# Patient Record
Sex: Female | Born: 1985 | Race: Black or African American | Hispanic: No | Marital: Single | Smoking: Never smoker
Health system: Southern US, Community
[De-identification: ages and names within clinical notes are randomized; demographics above are authoritative.]

## PROBLEM LIST (undated history)

## (undated) ENCOUNTER — Inpatient Hospital Stay (HOSPITAL_COMMUNITY): Payer: Self-pay

## (undated) DIAGNOSIS — Z789 Other specified health status: Secondary | ICD-10-CM

## (undated) HISTORY — DX: Other specified health status: Z78.9

---

## 2015-12-10 NOTE — L&D Delivery Note (Signed)
Delivery Note After approx 3 pushes total, At 8:44 PM a viable female was delivered via VBAC, Spontaneous (Presentation: ; Occiput Anterior).  APGAR: 9, 9; weight: pending .   Placenta status: Intact, Spontaneous.  Cord: 3 vessels with the following complications: None.  Infant dried and placed on pt's abd; cord clamped and cut by family member. Hospital cord blood sample collected.  Anesthesia: Epidural  Episiotomy:  none Lacerations:  L labial Suture Repair: 3.0 vicryl; repaired w/ two interrupted sutures Est. Blood Loss (mL):    Mom to postpartum.  Baby to Couplet care / Skin to Skin.  Cam HaiSHAW, Loyal Rudy CNM 06/21/2016, 9:03 PM

## 2016-04-02 ENCOUNTER — Inpatient Hospital Stay (HOSPITAL_COMMUNITY)
Admission: AD | Admit: 2016-04-02 | Discharge: 2016-04-02 | Disposition: A | Discharge status: Home / Self Care | Payer: Self-pay | Source: Ambulatory Visit | Attending: Family Medicine | Admitting: Family Medicine

## 2016-04-02 ENCOUNTER — Encounter (HOSPITAL_COMMUNITY): Payer: Self-pay | Admitting: *Deleted

## 2016-04-02 DIAGNOSIS — R109 Unspecified abdominal pain: Secondary | ICD-10-CM | POA: Insufficient documentation

## 2016-04-02 DIAGNOSIS — O368190 Decreased fetal movements, unspecified trimester, not applicable or unspecified: Secondary | ICD-10-CM

## 2016-04-02 DIAGNOSIS — Z3A29 29 weeks gestation of pregnancy: Secondary | ICD-10-CM | POA: Insufficient documentation

## 2016-04-02 DIAGNOSIS — O26899 Other specified pregnancy related conditions, unspecified trimester: Secondary | ICD-10-CM

## 2016-04-02 DIAGNOSIS — O9989 Other specified diseases and conditions complicating pregnancy, childbirth and the puerperium: Secondary | ICD-10-CM

## 2016-04-02 DIAGNOSIS — O26893 Other specified pregnancy related conditions, third trimester: Secondary | ICD-10-CM | POA: Insufficient documentation

## 2016-04-02 DIAGNOSIS — O368130 Decreased fetal movements, third trimester, not applicable or unspecified: Secondary | ICD-10-CM | POA: Insufficient documentation

## 2016-04-02 LAB — URINE MICROSCOPIC-ADD ON: RBC / HPF: NONE SEEN RBC/hpf (ref 0–5)

## 2016-04-02 LAB — URINALYSIS, ROUTINE W REFLEX MICROSCOPIC
Glucose, UA: NEGATIVE mg/dL
Hgb urine dipstick: NEGATIVE
KETONES UR: 15 mg/dL — AB
LEUKOCYTES UA: NEGATIVE
NITRITE: NEGATIVE
PH: 7 (ref 5.0–8.0)
Protein, ur: 30 mg/dL — AB
Specific Gravity, Urine: 1.02 (ref 1.005–1.030)

## 2016-04-02 LAB — CBC
HEMATOCRIT: 31.6 % — AB (ref 36.0–46.0)
HEMOGLOBIN: 10.8 g/dL — AB (ref 12.0–15.0)
MCH: 31.6 pg (ref 26.0–34.0)
MCHC: 34.2 g/dL (ref 30.0–36.0)
MCV: 92.4 fL (ref 78.0–100.0)
Platelets: 212 10*3/uL (ref 150–400)
RBC: 3.42 MIL/uL — AB (ref 3.87–5.11)
RDW: 13.6 % (ref 11.5–15.5)
WBC: 6.2 10*3/uL (ref 4.0–10.5)

## 2016-04-02 LAB — DIFFERENTIAL
Basophils Absolute: 0 10*3/uL (ref 0.0–0.1)
Basophils Relative: 0 %
EOS PCT: 3 %
Eosinophils Absolute: 0.2 10*3/uL (ref 0.0–0.7)
LYMPHS ABS: 2.1 10*3/uL (ref 0.7–4.0)
LYMPHS PCT: 34 %
MONOS PCT: 4 %
Monocytes Absolute: 0.2 10*3/uL (ref 0.1–1.0)
NEUTROS PCT: 59 %
Neutro Abs: 3.7 10*3/uL (ref 1.7–7.7)

## 2016-04-02 LAB — OB RESULTS CONSOLE ABO/RH: RH TYPE: POSITIVE

## 2016-04-02 LAB — OB RESULTS CONSOLE RPR: RPR: NONREACTIVE

## 2016-04-02 LAB — TYPE AND SCREEN
ABO/RH(D): B POS
ANTIBODY SCREEN: NEGATIVE

## 2016-04-02 LAB — ABO/RH: ABO/RH(D): B POS

## 2016-04-02 LAB — HEPATITIS B SURFACE ANTIGEN: Hepatitis B Surface Ag: NEGATIVE

## 2016-04-02 NOTE — MAU Note (Signed)
Urine in lab 

## 2016-04-02 NOTE — MAU Provider Note (Signed)
MAU HISTORY AND PHYSICAL  Chief Complaint:  Abdominal Pain and Decreased Fetal Movement   Tiffany Silva is a 30 y.o.  G2P1001 with IUP at 6523w4d presenting for Abdominal Pain and Decreased Fetal Movement  Lives in [place], here on vacation. Prenatal care there. Says normal labs and u/s. No complications. Last pregnancy c/b breech, required c/s.  Here today with pain at site of c/s scar. Last night decreased fetal movement. Today normal fetal movement. Otherwise feeling well. No fevers. No contractions. No bleeding. No leakage of fluid. No dysuria or hematuria. No back pain   History reviewed. No pertinent past medical history.  Past Surgical History  Procedure Laterality Date  . Cesarean section      History reviewed. No pertinent family history.  Social History  Substance Use Topics  . Smoking status: Never Smoker   . Smokeless tobacco: None  . Alcohol Use: No    No Known Allergies  No prescriptions prior to admission    Review of Systems - Negative except for what is mentioned in HPI.  Physical Exam  Blood pressure 110/70, pulse 88, temperature 98.1 F (36.7 C), temperature source Oral, resp. rate 18, height 5' 4.5" (1.638 m), weight 221 lb 3.2 oz (100.336 kg), last menstrual period 09/08/2015. GENERAL: Well-developed, well-nourished female in no acute distress.  LUNGS: Clear to auscultation bilaterally.  HEART: Regular rate and rhythm. ABDOMEN: Soft, nontender, nondistended, gravid.  EXTREMITIES: Nontender, no edema, 2+ distal pulses. Cervical Exam: deferred FHT:  145/mod/+a/-d Contractions: none  Bedside u/s: SDP 5.00 cm, AFI 12.06 cm   Labs: Results for orders placed or performed during the hospital encounter of 04/02/16 (from the past 24 hour(s))  Urinalysis, Routine w reflex microscopic (not [facility])   Collection Time: 04/02/16 11:43 AM  Result Value Ref Range   Color, Urine AMBER (A) YELLOW   APPearance CLEAR CLEAR   Specific Gravity, Urine 1.020 1.005  - 1.030   pH 7.0 5.0 - 8.0   Glucose, UA NEGATIVE NEGATIVE mg/dL   Hgb urine dipstick NEGATIVE NEGATIVE   Bilirubin Urine SMALL (A) NEGATIVE   Ketones, ur 15 (A) NEGATIVE mg/dL   Protein, ur 30 (A) NEGATIVE mg/dL   Nitrite NEGATIVE NEGATIVE   Leukocytes, UA NEGATIVE NEGATIVE  Urine microscopic-add on   Collection Time: 04/02/16 11:43 AM  Result Value Ref Range   Squamous Epithelial / LPF 0-5 (A) NONE SEEN   WBC, UA 0-5 0 - 5 WBC/hpf   RBC / HPF NONE SEEN 0 - 5 RBC/hpf   Bacteria, UA FEW (A) NONE SEEN   Crystals CA OXALATE CRYSTALS (A) NEGATIVE   Urine-Other MUCOUS PRESENT     Imaging Studies:  No results found.  Assessment: Tiffany Silva is  30 y.o. G2P1001 at 5023w4d presents with Abdominal Pain and Decreased Fetal Movement Abdominal pain is mild. No uterine tenderness or bleeding or non-reassuring FHTs to suggest abruption. Pain is in suprapubic region, but no other uti symptoms, and ua not suggestive of infection. Afebrile, normal PO, no right-sided pain - do not think appendix or hepatopancreobiliary. W/ regard to decreased fetal movement, per patient that has resolved, nst reactive for gestational age, and afi wnl.  Patient is in the country visiting from [place]. Not sure if she will return. I've thus collected ob labs and will order outpatient u/s as patient says she has only had an early u/s. Referring to our clinic.  Plan: - abdominal pain, ptl, pprom return precautions - kick counts, decreased fetal mvmt return precautions -  anatomy scan ordered - WOC f/u, message sent  Tiffany Silva 4/25/20171:39 PM

## 2016-04-02 NOTE — MAU Note (Signed)
Pain in lower abd, started 2 days ago- comes and goes, getting worse.  Decreased fetal movement, has not felt since last night.

## 2016-04-02 NOTE — Discharge Instructions (Signed)
Abdominal Pain During Pregnancy Abdominal pain is common in pregnancy. Most of the time, it does not cause harm. There are many causes of abdominal pain. Some causes are more serious than others. Some of the causes of abdominal pain in pregnancy are easily diagnosed. Occasionally, the diagnosis takes time to understand. Other times, the cause is not determined. Abdominal pain can be a sign that something is very wrong with the pregnancy, or the pain may have nothing to do with the pregnancy at all. For this reason, always tell your health care provider if you have any abdominal discomfort. HOME CARE INSTRUCTIONS  Monitor your abdominal pain for any changes. The following actions may help to alleviate any discomfort you are experiencing:  Do not have sexual intercourse or put anything in your vagina until your symptoms go away completely.  Get plenty of rest until your pain improves.  Drink clear fluids if you feel nauseous. Avoid solid food as long as you are uncomfortable or nauseous.  Only take over-the-counter or prescription medicine as directed by your health care provider.  Keep all follow-up appointments with your health care provider. SEEK IMMEDIATE MEDICAL CARE IF:  You are bleeding, leaking fluid, or passing tissue from the vagina.  You have increasing pain or cramping.  You have persistent vomiting.  You have painful or bloody urination.  You have a fever.  You notice a decrease in your baby's movements.  You have extreme weakness or feel faint.  You have shortness of breath, with or without abdominal pain.  You develop a severe headache with abdominal pain.  You have abnormal vaginal discharge with abdominal pain.  You have persistent diarrhea.  You have abdominal pain that continues even after rest, or gets worse. MAKE SURE YOU:   Understand these instructions.  Will watch your condition.  Will get help right away if you are not doing well or get worse.     This information is not intended to replace advice given to you by your health care provider. Make sure you discuss any questions you have with your health care provider.   Document Released: 11/25/2005 Document Revised: 09/15/2013 Document Reviewed: 06/24/2013 Elsevier Interactive Patient Education 2016 Elsevier Inc. Fetal Movement Counts Patient Name: __________________________________________________ Patient Due Date: ____________________ Performing a fetal movement count is highly recommended in high-risk pregnancies, but it is good for every pregnant woman to do. Your health care provider may ask you to start counting fetal movements at 28 weeks of the pregnancy. Fetal movements often increase:  After eating a full meal.  After physical activity.  After eating or drinking something sweet or cold.  At rest. Pay attention to when you feel the baby is most active. This will help you notice a pattern of your baby's sleep and wake cycles and what factors contribute to an increase in fetal movement. It is important to perform a fetal movement count at the same time each day when your baby is normally most active.  HOW TO COUNT FETAL MOVEMENTS  Find a quiet and comfortable area to sit or lie down on your left side. Lying on your left side provides the best blood and oxygen circulation to your baby.  Write down the day and time on a sheet of paper or in a journal.  Start counting kicks, flutters, swishes, rolls, or jabs in a 2-hour period. You should feel at least 10 movements within 2 hours.  If you do not feel 10 movements in 2 hours, wait 2-3 hours  and count again. Look for a change in the pattern or not enough counts in 2 hours. SEEK MEDICAL CARE IF:  You feel less than 10 counts in 2 hours, tried twice.  There is no movement in over an hour.  The pattern is changing or taking longer each day to reach 10 counts in 2 hours.  You feel the baby is not moving as he or she usually  does. Date: ____________ Movements: ____________ Start time: ____________ Doreatha Martin time: ____________  Date: ____________ Movements: ____________ Start time: ____________ Doreatha Martin time: ____________ Date: ____________ Movements: ____________ Start time: ____________ Doreatha Martin time: ____________ Date: ____________ Movements: ____________ Start time: ____________ Doreatha Martin time: ____________ Date: ____________ Movements: ____________ Start time: ____________ Doreatha Martin time: ____________ Date: ____________ Movements: ____________ Start time: ____________ Doreatha Martin time: ____________ Date: ____________ Movements: ____________ Start time: ____________ Doreatha Martin time: ____________ Date: ____________ Movements: ____________ Start time: ____________ Doreatha Martin time: ____________  Date: ____________ Movements: ____________ Start time: ____________ Doreatha Martin time: ____________ Date: ____________ Movements: ____________ Start time: ____________ Doreatha Martin time: ____________ Date: ____________ Movements: ____________ Start time: ____________ Doreatha Martin time: ____________ Date: ____________ Movements: ____________ Start time: ____________ Doreatha Martin time: ____________ Date: ____________ Movements: ____________ Start time: ____________ Doreatha Martin time: ____________ Date: ____________ Movements: ____________ Start time: ____________ Doreatha Martin time: ____________ Date: ____________ Movements: ____________ Start time: ____________ Doreatha Martin time: ____________  Date: ____________ Movements: ____________ Start time: ____________ Doreatha Martin time: ____________ Date: ____________ Movements: ____________ Start time: ____________ Doreatha Martin time: ____________ Date: ____________ Movements: ____________ Start time: ____________ Doreatha Martin time: ____________ Date: ____________ Movements: ____________ Start time: ____________ Doreatha Martin time: ____________ Date: ____________ Movements: ____________ Start time: ____________ Doreatha Martin time: ____________ Date: ____________ Movements:  ____________ Start time: ____________ Doreatha Martin time: ____________ Date: ____________ Movements: ____________ Start time: ____________ Doreatha Martin time: ____________  Date: ____________ Movements: ____________ Start time: ____________ Doreatha Martin time: ____________ Date: ____________ Movements: ____________ Start time: ____________ Doreatha Martin time: ____________ Date: ____________ Movements: ____________ Start time: ____________ Doreatha Martin time: ____________ Date: ____________ Movements: ____________ Start time: ____________ Doreatha Martin time: ____________ Date: ____________ Movements: ____________ Start time: ____________ Doreatha Martin time: ____________ Date: ____________ Movements: ____________ Start time: ____________ Doreatha Martin time: ____________ Date: ____________ Movements: ____________ Start time: ____________ Doreatha Martin time: ____________  Date: ____________ Movements: ____________ Start time: ____________ Doreatha Martin time: ____________ Date: ____________ Movements: ____________ Start time: ____________ Doreatha Martin time: ____________ Date: ____________ Movements: ____________ Start time: ____________ Doreatha Martin time: ____________ Date: ____________ Movements: ____________ Start time: ____________ Doreatha Martin time: ____________ Date: ____________ Movements: ____________ Start time: ____________ Doreatha Martin time: ____________ Date: ____________ Movements: ____________ Start time: ____________ Doreatha Martin time: ____________ Date: ____________ Movements: ____________ Start time: ____________ Doreatha Martin time: ____________  Date: ____________ Movements: ____________ Start time: ____________ Doreatha Martin time: ____________ Date: ____________ Movements: ____________ Start time: ____________ Doreatha Martin time: ____________ Date: ____________ Movements: ____________ Start time: ____________ Doreatha Martin time: ____________ Date: ____________ Movements: ____________ Start time: ____________ Doreatha Martin time: ____________ Date: ____________ Movements: ____________ Start time: ____________ Doreatha Martin  time: ____________ Date: ____________ Movements: ____________ Start time: ____________ Doreatha Martin time: ____________ Date: ____________ Movements: ____________ Start time: ____________ Doreatha Martin time: ____________  Date: ____________ Movements: ____________ Start time: ____________ Doreatha Martin time: ____________ Date: ____________ Movements: ____________ Start time: ____________ Doreatha Martin time: ____________ Date: ____________ Movements: ____________ Start time: ____________ Doreatha Martin time: ____________ Date: ____________ Movements: ____________ Start time: ____________ Doreatha Martin time: ____________ Date: ____________ Movements: ____________ Start time: ____________ Doreatha Martin time: ____________ Date: ____________ Movements: ____________ Start time: ____________ Doreatha Martin time: ____________ Date: ____________ Movements: ____________ Start time: ____________ Doreatha Martin time: ____________  Date: ____________ Movements: ____________ Start time: ____________ Doreatha Martin time: ____________ Date: ____________ Movements: ____________ Start time:  ____________ Doreatha MartinFinish time: ____________ Date: ____________ Movements: ____________ Start time: ____________ Doreatha MartinFinish time: ____________ Date: ____________ Movements: ____________ Start time: ____________ Doreatha MartinFinish time: ____________ Date: ____________ Movements: ____________ Start time: ____________ Doreatha MartinFinish time: ____________ Date: ____________ Movements: ____________ Start time: ____________ Doreatha MartinFinish time: ____________   This information is not intended to replace advice given to you by your health care provider. Make sure you discuss any questions you have with your health care provider.   Document Released: 12/25/2006 Document Revised: 12/16/2014 Document Reviewed: 09/21/2012 Elsevier Interactive Patient Education [employer]2016 Elsevier Inc[employer]

## 2016-04-02 NOTE — MAU Note (Signed)
Pt c/o abdominal pain x 2 days and decreased fetal movement since last night. Denies LOF or VB. Denies intercourse >24 hours. Hx of 1 CS for breech in [place]. [place] the [place] for a visit only, plans to return home on May 20th.

## 2016-04-02 NOTE — MAU Note (Signed)
Arrived 4/17 from [place]; here for a visit

## 2016-04-03 LAB — RPR: RPR Ser Ql: NONREACTIVE

## 2016-04-03 LAB — RUBELLA SCREEN: Rubella: 6.87 index (ref >0.99)

## 2016-04-03 LAB — CULTURE, OB URINE

## 2016-04-03 LAB — HIV ANTIBODY (ROUTINE TESTING W REFLEX): HIV Screen 4th Generation wRfx: NONREACTIVE

## 2016-04-09 ENCOUNTER — Encounter (HOSPITAL_COMMUNITY): Payer: Self-pay | Admitting: Obstetrics and Gynecology

## 2016-04-15 ENCOUNTER — Other Ambulatory Visit (HOSPITAL_COMMUNITY): Payer: Self-pay | Admitting: Obstetrics and Gynecology

## 2016-04-15 ENCOUNTER — Ambulatory Visit (HOSPITAL_COMMUNITY)
Admission: RE | Admit: 2016-04-15 | Discharge: 2016-04-15 | Disposition: A | Discharge status: Home / Self Care | Payer: Self-pay | Source: Ambulatory Visit | Attending: Obstetrics and Gynecology | Admitting: Obstetrics and Gynecology

## 2016-04-15 DIAGNOSIS — Z3689 Encounter for other specified antenatal screening: Secondary | ICD-10-CM

## 2016-04-15 DIAGNOSIS — O368190 Decreased fetal movements, unspecified trimester, not applicable or unspecified: Secondary | ICD-10-CM

## 2016-04-15 DIAGNOSIS — Z3A31 31 weeks gestation of pregnancy: Secondary | ICD-10-CM

## 2016-04-15 DIAGNOSIS — O26899 Other specified pregnancy related conditions, unspecified trimester: Secondary | ICD-10-CM

## 2016-04-15 DIAGNOSIS — O0933 Supervision of pregnancy with insufficient antenatal care, third trimester: Secondary | ICD-10-CM

## 2016-04-15 DIAGNOSIS — Z36 Encounter for antenatal screening of mother: Secondary | ICD-10-CM | POA: Insufficient documentation

## 2016-04-15 DIAGNOSIS — R109 Unspecified abdominal pain: Secondary | ICD-10-CM

## 2016-04-22 ENCOUNTER — Ambulatory Visit (INDEPENDENT_AMBULATORY_CARE_PROVIDER_SITE_OTHER): Payer: Self-pay | Admitting: Family Medicine

## 2016-04-22 ENCOUNTER — Encounter: Payer: Self-pay | Admitting: Family Medicine

## 2016-04-22 VITALS — BP 102/68 | HR 81 | Ht 65.0 in | Wt 223.3 lb

## 2016-04-22 DIAGNOSIS — O0930 Supervision of pregnancy with insufficient antenatal care, unspecified trimester: Secondary | ICD-10-CM | POA: Insufficient documentation

## 2016-04-22 DIAGNOSIS — O34219 Maternal care for unspecified type scar from previous cesarean delivery: Secondary | ICD-10-CM

## 2016-04-22 DIAGNOSIS — O0933 Supervision of pregnancy with insufficient antenatal care, third trimester: Secondary | ICD-10-CM

## 2016-04-22 DIAGNOSIS — Z124 Encounter for screening for malignant neoplasm of cervix: Secondary | ICD-10-CM

## 2016-04-22 DIAGNOSIS — Z113 Encounter for screening for infections with a predominantly sexual mode of transmission: Secondary | ICD-10-CM

## 2016-04-22 DIAGNOSIS — O3429 Maternal care due to uterine scar from other previous surgery: Secondary | ICD-10-CM

## 2016-04-22 DIAGNOSIS — O0993 Supervision of high risk pregnancy, unspecified, third trimester: Secondary | ICD-10-CM | POA: Insufficient documentation

## 2016-04-22 DIAGNOSIS — Z1151 Encounter for screening for human papillomavirus (HPV): Secondary | ICD-10-CM

## 2016-04-22 DIAGNOSIS — Z23 Encounter for immunization: Secondary | ICD-10-CM

## 2016-04-22 LAB — POCT URINALYSIS DIP (DEVICE)
Glucose, UA: NEGATIVE mg/dL
Hgb urine dipstick: NEGATIVE
KETONES UR: NEGATIVE mg/dL
Leukocytes, UA: NEGATIVE
Nitrite: NEGATIVE
PH: 6 (ref 5.0–8.0)
PROTEIN: 30 mg/dL — AB
UROBILINOGEN UA: 1 mg/dL (ref 0.0–1.0)

## 2016-04-22 NOTE — Patient Instructions (Signed)

## 2016-04-22 NOTE — Progress Notes (Signed)
Subjective:  Tiffany CirriRegina Silva is a 30 y.o. G2P1001 at 5642w3d being seen today for ongoing prenatal care.  She is currently monitored for the following issues for this low-risk pregnancy and has Supervision of high risk pregnancy, antepartum; Late prenatal care affecting pregnancy, antepartum; and Uterine scar from previous cesarean delivery affecting pregnancy on her problem list.  Patient reports no complaints.  Contractions: Not present. Vag. Bleeding: None.  Movement: Present. Denies leaking of fluid.   The following portions of the patient's history were reviewed and updated as appropriate: allergies, current medications, past family history, past medical history, past social history, past surgical history and problem list. Problem list updated.  Objective:   Filed Vitals:   04/22/16 0807 04/22/16 0812  BP: 102/68   Pulse: 81   Height:  5\' 5"  (1.651 m)  Weight: 223 lb 4.8 oz (101.288 kg)     Fetal Status:     Movement: Present     General:  Alert, oriented and cooperative. Patient is in no acute distress.  Skin: Skin is warm and dry. No rash noted.   Cardiovascular: Normal heart rate noted  Respiratory: Normal respiratory effort, no problems with respiration noted  Abdomen: Soft, gravid, appropriate for gestational age. Pain/Pressure: Present     Pelvic: Vag. Bleeding: None     Cervical exam performed        Extremities: Normal range of motion.  Edema: Trace  Mental Status: Normal mood and affect. Normal behavior. Normal judgment and thought content.   Urinalysis:      Assessment and Plan:  Pregnancy: G2P1001 at 4542w3d  1. Supervision of high risk pregnancy, antepartum, third trimester - added box and discussed care model - Cytology - PAP - GC[facility]Chlamydia probe amp ([place]; Standing - Culture, OB Urine - Prescript Monitor Profile(19) - Flu Vaccine QUAD 36+ mos IM - GC/Chlamydia probe amp ([place]  2. Late prenatal care affecting pregnancy,  antepartum, third trimester  3. Uterine scar from previous cesarean delivery affecting pregnancy Given TOLAC consent, discussed at length. Patient would like to read  Preterm labor symptoms and general obstetric precautions including but not limited to vaginal bleeding, contractions, leaking of fluid and fetal movement were reviewed in detail with the patient. Please refer to After Visit Summary for other counseling recommendations.  Return in about 2 weeks (around 05/06/2016) for Routine prenatal care.   Federico FlakeKimberly Niles Adeena Bernabe, MD

## 2016-04-23 ENCOUNTER — Encounter: Payer: Self-pay | Admitting: Family Medicine

## 2016-04-23 LAB — GC/CHLAMYDIA PROBE AMP (~~LOC~~) NOT AT ARMC
CHLAMYDIA, DNA PROBE: NEGATIVE
Neisseria Gonorrhea: NEGATIVE

## 2016-04-23 LAB — GLUCOSE TOLERANCE, 1 HOUR (50G) W/O FASTING: Glucose, 1 Hr, gestational: 119 mg/dL (ref <140)

## 2016-04-23 LAB — CYTOLOGY - PAP

## 2016-04-24 LAB — PRESCRIPTION MONITORING PROFILE (19 PANEL)
AMPHETAMINE/METH: NEGATIVE ng/mL
BARBITURATE SCREEN, URINE: NEGATIVE ng/mL
BENZODIAZEPINE SCREEN, URINE: NEGATIVE ng/mL
BUPRENORPHINE, URINE: NEGATIVE ng/mL
Cannabinoid Scrn, Ur: NEGATIVE ng/mL
Carisoprodol, Urine: NEGATIVE ng/mL
Cocaine Metabolites: NEGATIVE ng/mL
Creatinine, Urine: 253.58 mg/dL (ref >20.0)
FENTANYL URINE: NEGATIVE ng/mL
MDMA URINE: NEGATIVE ng/mL
METHAQUALONE SCREEN (URINE): NEGATIVE ng/mL
Meperidine, Ur: NEGATIVE ng/mL
Methadone Screen, Urine: NEGATIVE ng/mL
NITRITES URINE, INITIAL: NEGATIVE ug/mL
OPIATE SCREEN, URINE: NEGATIVE ng/mL
OXYCODONE SCRN UR: NEGATIVE ng/mL
Phencyclidine, Ur: NEGATIVE ng/mL
Propoxyphene: NEGATIVE ng/mL
TAPENTADOLUR: NEGATIVE ng/mL
Tramadol Scrn, Ur: NEGATIVE ng/mL
ZOLPIDEM, URINE: NEGATIVE ng/mL
pH, Initial: 6.7 pH (ref 4.5–8.9)

## 2016-04-24 LAB — CULTURE, OB URINE: Colony Count: 2000

## 2016-04-26 ENCOUNTER — Telehealth: Payer: Self-pay | Admitting: Obstetrics & Gynecology

## 2016-04-26 NOTE — Telephone Encounter (Signed)
Patient want a RX for Prenatal Vitamin's     [facility] [address]

## 2016-05-13 ENCOUNTER — Ambulatory Visit (INDEPENDENT_AMBULATORY_CARE_PROVIDER_SITE_OTHER): Payer: Self-pay | Admitting: Obstetrics and Gynecology

## 2016-05-13 VITALS — BP 111/80 | HR 93 | Wt 223.1 lb

## 2016-05-13 DIAGNOSIS — O3429 Maternal care due to uterine scar from other previous surgery: Secondary | ICD-10-CM

## 2016-05-13 DIAGNOSIS — O34219 Maternal care for unspecified type scar from previous cesarean delivery: Secondary | ICD-10-CM

## 2016-05-13 DIAGNOSIS — Z3483 Encounter for supervision of other normal pregnancy, third trimester: Secondary | ICD-10-CM

## 2016-05-13 DIAGNOSIS — O0993 Supervision of high risk pregnancy, unspecified, third trimester: Secondary | ICD-10-CM

## 2016-05-13 LAB — POCT URINALYSIS DIP (DEVICE)
GLUCOSE, UA: NEGATIVE mg/dL
Hgb urine dipstick: NEGATIVE
KETONES UR: NEGATIVE mg/dL
Leukocytes, UA: NEGATIVE
Nitrite: NEGATIVE
PH: 6 (ref 5.0–8.0)
PROTEIN: NEGATIVE mg/dL
SPECIFIC GRAVITY, URINE: 1.025 (ref 1.005–1.030)
UROBILINOGEN UA: 1 mg/dL (ref 0.0–1.0)

## 2016-05-13 LAB — OB RESULTS CONSOLE GBS: GBS: NEGATIVE

## 2016-05-13 NOTE — Addendum Note (Signed)
Addended by: Garret ReddishBARNES, Yasmyn Bellisario M on: 05/13/2016 03:25 PM   Modules accepted: Orders

## 2016-05-13 NOTE — Progress Notes (Signed)
Cultures today 

## 2016-05-13 NOTE — Progress Notes (Signed)
Subjective:  Tiffany CirriRegina Silva is a 30 y.o. G2P1001 at 4353w3d being seen today for ongoing prenatal care.  She is currently monitored for the following issues for this low-risk pregnancy and has Supervision of high risk pregnancy, antepartum; Late prenatal care affecting pregnancy, antepartum; and Uterine scar from previous cesarean delivery affecting pregnancy on her problem list.  Patient reports no complaints.  Contractions: Not present. Vag. Bleeding: None.  Movement: Present. Denies leaking of fluid.   The following portions of the patient's history were reviewed and updated as appropriate: allergies, current medications, past family history, past medical history, past social history, past surgical history and problem list. Problem list updated.  Objective:   Filed Vitals:   05/13/16 0847  BP: 111/80  Pulse: 93  Weight: 223 lb 1.6 oz (101.197 kg)    Fetal Status: Fetal Heart Rate (bpm): 145   Movement: Present     General:  Alert, oriented and cooperative. Patient is in no acute distress.  Skin: Skin is warm and dry. No rash noted.   Cardiovascular: Normal heart rate noted  Respiratory: Normal respiratory effort, no problems with respiration noted  Abdomen: Soft, gravid, appropriate for gestational age. Pain/Pressure: Present     Pelvic: Vag. Bleeding: None     Cervical exam deferred        Extremities: Normal range of motion.  Edema: None  Mental Status: Normal mood and affect. Normal behavior. Normal judgment and thought content.   Urinalysis: Urine Protein: Negative Urine Glucose: Negative  Assessment and Plan:  Pregnancy: G2P1001 at 3153w3d  1. Supervision of high risk pregnancy, antepartum, third trimester - vbac consent signed - Culture, beta strep (group b only) - GC/Chlamydia probe amp ([place]  Preterm labor symptoms and general obstetric precautions including but not limited to vaginal bleeding, contractions, leaking of fluid and fetal movement were  reviewed in detail with the patient. Please refer to After Visit Summary for other counseling recommendations.  F/u 2 weeks  Kathrynn RunningNoah Bedford Rozina Pointer, MD

## 2016-05-14 LAB — GC/CHLAMYDIA PROBE AMP (~~LOC~~) NOT AT ARMC
CHLAMYDIA, DNA PROBE: NEGATIVE
CHLAMYDIA, DNA PROBE: NEGATIVE
NEISSERIA GONORRHEA: NEGATIVE
Neisseria Gonorrhea: NEGATIVE

## 2016-05-15 LAB — CULTURE, BETA STREP (GROUP B ONLY)

## 2016-05-21 ENCOUNTER — Encounter: Payer: Self-pay | Admitting: *Deleted

## 2016-05-27 ENCOUNTER — Ambulatory Visit (INDEPENDENT_AMBULATORY_CARE_PROVIDER_SITE_OTHER): Payer: Self-pay | Admitting: Obstetrics and Gynecology

## 2016-05-27 VITALS — BP 116/68 | HR 95 | Temp 98.5°F | Wt 226.9 lb

## 2016-05-27 DIAGNOSIS — Z3483 Encounter for supervision of other normal pregnancy, third trimester: Secondary | ICD-10-CM

## 2016-05-27 DIAGNOSIS — N390 Urinary tract infection, site not specified: Secondary | ICD-10-CM

## 2016-05-27 DIAGNOSIS — O2343 Unspecified infection of urinary tract in pregnancy, third trimester: Secondary | ICD-10-CM

## 2016-05-27 LAB — POCT URINALYSIS DIP (DEVICE)
Glucose, UA: NEGATIVE mg/dL
Hgb urine dipstick: NEGATIVE
Ketones, ur: NEGATIVE mg/dL
Leukocytes, UA: NEGATIVE
NITRITE: POSITIVE — AB
PROTEIN: 100 mg/dL — AB
Specific Gravity, Urine: >=1.03 (ref 1.005–1.030)
UROBILINOGEN UA: 1 mg/dL (ref 0.0–1.0)
pH: 6 (ref 5.0–8.0)

## 2016-05-27 NOTE — Progress Notes (Signed)
Urinalyis shows positive nitrites.

## 2016-05-27 NOTE — Progress Notes (Signed)
Subjective:  Tiffany Silva is a 30 y.o. G2P1001 at 2146w3d being seen today for ongoing prenatal care.  She is currently monitored for the following issues for this low-risk pregnancy and has Supervision of low-risk pregnancy; Late prenatal care affecting pregnancy, antepartum; and Uterine scar from previous cesarean delivery affecting pregnancy on her problem list.  Patient reports no complaints. No dysuria, frequency, or urgency. Contractions: Irregular. Vag. Bleeding: None.  Movement: Present. Denies leaking of fluid.   The following portions of the patient's history were reviewed and updated as appropriate: allergies, current medications, past family history, past medical history, past social history, past surgical history and problem list. Problem list updated.  Objective:   Filed Vitals:   05/27/16 1027  BP: 116/68  Pulse: 95  Temp: 98.5 F (36.9 C)  Weight: 226 lb 14.4 oz (102.921 kg)    Fetal Status: Fetal Heart Rate (bpm): 150   Movement: Present     General:  Alert, oriented and cooperative. Patient is in no acute distress.  Skin: Skin is warm and dry. No rash noted.   Cardiovascular: Normal heart rate noted  Respiratory: Normal respiratory effort, no problems with respiration noted  Abdomen: Soft, gravid, appropriate for gestational age. Pain/Pressure: Present     Pelvic: Cervical exam deferred        Extremities: Normal range of motion.  Edema: None  Mental Status: Normal mood and affect. Normal behavior. Normal judgment and thought content.   Urinalysis: Urine Protein: 2+ Urine Glucose: Negative  Assessment and Plan:  Pregnancy: G2P1001 at 9146w3d  # pregnancy - nitrites in urine, asymptomatic, will send for culture.   Term labor symptoms and general obstetric precautions including but not limited to vaginal bleeding, contractions, leaking of fluid and fetal movement were reviewed in detail with the patient. Please refer to After Visit Summary for other counseling  recommendations.  F/u 1 week  Kathrynn RunningNoah Bedford Piera Downs, MD

## 2016-05-28 LAB — CULTURE, OB URINE

## 2016-06-04 ENCOUNTER — Ambulatory Visit (INDEPENDENT_AMBULATORY_CARE_PROVIDER_SITE_OTHER): Payer: Self-pay | Admitting: Student

## 2016-06-04 VITALS — BP 115/75 | HR 101 | Wt 227.9 lb

## 2016-06-04 DIAGNOSIS — O2343 Unspecified infection of urinary tract in pregnancy, third trimester: Secondary | ICD-10-CM

## 2016-06-04 DIAGNOSIS — O219 Vomiting of pregnancy, unspecified: Secondary | ICD-10-CM

## 2016-06-04 DIAGNOSIS — O3429 Maternal care due to uterine scar from other previous surgery: Secondary | ICD-10-CM

## 2016-06-04 DIAGNOSIS — Z3493 Encounter for supervision of normal pregnancy, unspecified, third trimester: Secondary | ICD-10-CM

## 2016-06-04 DIAGNOSIS — O34219 Maternal care for unspecified type scar from previous cesarean delivery: Secondary | ICD-10-CM

## 2016-06-04 LAB — POCT URINALYSIS DIP (DEVICE)
Glucose, UA: NEGATIVE mg/dL
Ketones, ur: NEGATIVE mg/dL
Leukocytes, UA: NEGATIVE
NITRITE: POSITIVE — AB
PH: 6 (ref 5.0–8.0)
PROTEIN: 100 mg/dL — AB
Specific Gravity, Urine: >=1.03 (ref 1.005–1.030)
UROBILINOGEN UA: 2 mg/dL — AB (ref 0.0–1.0)

## 2016-06-04 MED ORDER — ONDANSETRON 4 MG PO TBDP: 4.0000 mg | ORAL_TABLET | Freq: Three times a day (TID) | ORAL | Status: DC | PRN

## 2016-06-04 MED ORDER — NITROFURANTOIN MONOHYD MACRO 100 MG PO CAPS: 100.0000 mg | ORAL_CAPSULE | Freq: Two times a day (BID) | ORAL | Status: DC

## 2016-06-04 NOTE — Progress Notes (Signed)
Subjective:  Tiffany Silva is a 30 y.o. G2P1001 at 1416w4d being seen today for ongoing prenatal care.  She is currently monitored for the following issues for this low-risk pregnancy and has Supervision of low-risk pregnancy; Late prenatal care affecting pregnancy, antepartum; and Uterine scar from previous cesarean delivery affecting pregnancy on her problem list.  Patient reports urinary frequency, irregular contractions, & nausea. Denies dysuria, fever/chills, hematuria, or flank pain. Contractions: Not present. Vag. Bleeding: None.  Movement: Present. Denies leaking of fluid.   The following portions of the patient's history were reviewed and updated as appropriate: allergies, current medications, past family history, past medical history, past social history, past surgical history and problem list. Problem list updated.  Objective:   Filed Vitals:   06/04/16 0933  BP: 115/75  Pulse: 101  Weight: 227 lb 14.4 oz (103.375 kg)    Fetal Status: Fetal Heart Rate (bpm): 140 Fundal Height: 37 cm Movement: Present  Presentation: Vertex  General:  Alert, oriented and cooperative. Patient is in no acute distress.  Skin: Skin is warm and dry. No rash noted.   Cardiovascular: Normal heart rate noted  Respiratory: Normal respiratory effort, no problems with respiration noted  Abdomen: Soft, gravid, appropriate for gestational age. Pain/Pressure: Present     Pelvic: Cervical exam performed Dilation: Closed Effacement (%): Thick    Extremities: Normal range of motion.     Mental Status: Normal mood and affect. Normal behavior. Normal judgment and thought content.   Urinalysis:    +nitrites   Assessment and Plan:  Pregnancy: G2P1001 at 4316w4d  1. Supervision of low-risk pregnancy, third trimester   2. Uterine scar from previous cesarean delivery affecting pregnancy   3. Nausea/vomiting in pregnancy  - ondansetron (ZOFRAN ODT) 4 MG disintegrating tablet; Take 1 tablet (4 mg total) by mouth  every 8 (eight) hours as needed for nausea or vomiting.  Dispense: 15 tablet; Refill: 0  4. UTI in pregnancy, third trimester  - nitrofurantoin, macrocrystal-monohydrate, (MACROBID) 100 MG capsule; Take 1 capsule (100 mg total) by mouth 2 (two) times daily.  Dispense: 14 capsule; Refill: 0 - Culture, OB Urine  Term labor symptoms and general obstetric precautions including but not limited to vaginal bleeding, contractions, leaking of fluid and fetal movement were reviewed in detail with the patient. Please refer to After Visit Summary for other counseling recommendations.  Return in about 1 week (around 06/11/2016) for Routine OB.   Judeth HornErin Admir Candelas, NP

## 2016-06-04 NOTE — Patient Instructions (Signed)
Vaginal Birth After Cesarean Delivery  Vaginal birth after cesarean delivery (VBAC) is giving birth vaginally after previously delivering a baby by a cesarean. In the past, if a woman had a cesarean delivery, all births afterward would be done by cesarean delivery. This is no longer true. It can be safe for the mother to try a vaginal delivery after having a cesarean delivery.   It is important to discuss VBAC with your health care provider early in the pregnancy so you can understand the risks, benefits, and options. It will give you time to decide what is best in your particular case. The final decision about whether to have a VBAC or repeat cesarean delivery should be between you and your health care provider. Any changes in your health or your baby's health during your pregnancy may make it necessary to change your initial decision about VBAC.   WOMEN WHO PLAN TO HAVE A VBAC SHOULD CHECK WITH THEIR HEALTH CARE PROVIDER TO BE SURE THAT:  · The previous cesarean delivery was done with a low transverse uterine cut (incision) (not a vertical classical incision).    · The birth canal is big enough for the baby.    · There were no other operations on the uterus.    · An electronic fetal monitor (EFM) will be on at all times during labor.    · An operating room will be available and ready in case an emergency cesarean delivery is needed.    · A health care provider and surgical nursing staff will be available at all times during labor to be ready to do an emergency delivery cesarean if necessary.    · An anesthesiologist will be present in case an emergency cesarean delivery is needed.    · The nursery is prepared and has adequate personnel and necessary equipment available to care for the baby in case of an emergency cesarean delivery.  BENEFITS OF VBAC  · Shorter stay in the hospital.    · Avoidance of risks associated with cesarean delivery, such as:    Surgical complications, such as opening of the incision or  hernia in the incision.    Injury to other organs.    Fever. This can occur if an infection develops after surgery. It can also occur as a reaction to the medicine given to make you numb during the surgery.  · Less blood loss and need for blood transfusions.  · Lower risk of blood clots and infection.   · Shorter recovery.    · Decreased risk for having to remove the uterus (hysterectomy).    · Decreased risk for the placenta to completely or partially cover the opening of the uterus (placenta previa) with a future pregnancy.    · Decrease risk in future labor and delivery.  RISKS OF A VBAC  · Tearing (rupture) of the uterus. This is occurs in less than 1% of VBACs. The risk of this happening is higher if:    Steps are taken to begin the labor process (induce labor) or stimulate or strengthen contractions (augment labor).      Medicine is used to soften (ripen) the cervix.  · Having to remove the uterus (hysterectomy) if it ruptures.  VBAC SHOULD NOT BE DONE IF:  · The previous cesarean delivery was done with a vertical (classical) or T-shaped incision or you do not know what kind of incision was made.    · You had a ruptured uterus.    · You have had certain types of surgery on your uterus, such as removal of uterine fibroids.   Ask your health care provider about other types of surgeries that prevent you from having a VBAC.  · You have certain medical or childbirth (obstetrical) problems.    · There are problems with the baby.    · You have had two previous cesarean deliveries and no vaginal deliveries.  OTHER FACTS TO KNOW ABOUT VBAC:  · It is safe to have an epidural anesthetic with VBAC.    · It is safe to turn the baby from a breech position (attempt an external cephalic version).    · It is safe to try a VBAC with twins.    · VBAC may not be successful if your baby weights 8.8 lb (4 kg) or more. However, weight predictions are not always accurate and should not be used alone to decide if VBAC is right for  you.  · There is an increased failure rate if the time between the cesarean delivery and VBAC is less than 19 months.    · Your health care provider may advise against a VBAC if you have preeclampsia (high blood pressure, protein in the urine, and swelling of face and extremities).    · VBAC is often successful if you previously gave birth vaginally.    · VBAC is often successful when the labor starts spontaneously before the due date.    · Delivering a baby through a VBAC is similar to having a normal spontaneous vaginal delivery.     This information is not intended to replace advice given to you by your health care provider. Make sure you discuss any questions you have with your health care provider.     Document Released: 05/18/2007 Document Revised: 12/16/2014 Document Reviewed: 06/24/2013  Elsevier Interactive Patient Education ©2016 [employer]

## 2016-06-06 LAB — CULTURE, OB URINE

## 2016-06-12 ENCOUNTER — Ambulatory Visit (INDEPENDENT_AMBULATORY_CARE_PROVIDER_SITE_OTHER): Payer: Self-pay | Admitting: Obstetrics and Gynecology

## 2016-06-12 VITALS — BP 112/74 | HR 106 | Wt 224.3 lb

## 2016-06-12 DIAGNOSIS — Z3493 Encounter for supervision of normal pregnancy, unspecified, third trimester: Secondary | ICD-10-CM

## 2016-06-12 DIAGNOSIS — O3429 Maternal care due to uterine scar from other previous surgery: Secondary | ICD-10-CM

## 2016-06-12 DIAGNOSIS — O34219 Maternal care for unspecified type scar from previous cesarean delivery: Secondary | ICD-10-CM

## 2016-06-12 LAB — POCT URINALYSIS DIP (DEVICE)
Glucose, UA: NEGATIVE mg/dL
HGB URINE DIPSTICK: NEGATIVE
LEUKOCYTES UA: NEGATIVE
NITRITE: POSITIVE — AB
PH: 5.5 (ref 5.0–8.0)
Protein, ur: 100 mg/dL — AB
Specific Gravity, Urine: 1.025 (ref 1.005–1.030)
UROBILINOGEN UA: 4 mg/dL — AB (ref 0.0–1.0)

## 2016-06-12 NOTE — Progress Notes (Signed)
Subjective:  Tiffany Silva is a 30 y.o. G2P1001 at 4658w5d being seen today for ongoing prenatal care.  Patient reports no complaints. States she got early PN care and US confirming LMP EDD in [place] and plans to return to [place] at about 1 month postpartum. States first baby was breech and weighed 11# with different FOB.  Still desires TOLAC. Contractions: Not present.  Vag. Bleeding: None. Movement: Present. Denies leaking of fluid.   The following portions of the patient's history were reviewed and updated as appropriate: allergies, current medications, past family history, past medical history, past social history, past surgical history and problem list.   Objective:   Filed Vitals:   06/12/16 1103  BP: 112/74  Pulse: 106  Weight: 224 lb 4.8 oz (101.742 kg)    Fetal Status: Fetal Heart Rate (bpm): 144   Movement: Present     General:  Alert, oriented and cooperative. Patient is in no acute distress.  Skin: Skin is warm and dry. No rash noted.   Cardiovascular: Normal heart rate noted  Respiratory: Normal respiratory effort, no problems with respiration noted  Abdomen: Soft, gravid, appropriate for gestational age. Pain/Pressure: Present   EFW8#  Vaginal: Vag. Bleeding: None.       Cervix: Exam revealed     posterior closed/thick /-3  Extremities: Normal range of motion.  Edema: None  Mental Status: Normal mood and affect. Normal behavior. Normal judgment and thought content.   Urinalysis:      Assessment and Plan:  Pregnancy: G2P1001 at 3858w5d  1. Supervision of low-risk pregnancy, third trimester Doing well  Term labor symptoms and general obstetric precautions including but not limited to vaginal bleeding, contractions, leaking of fluid and fetal movement were reviewed in detail with the patient. Please refer to After Visit Summary for other counseling recommendations.  Return in about 1 week (around 06/19/2016).   Danae Orleanseirdre C Poe, CNM

## 2016-06-12 NOTE — Patient Instructions (Signed)

## 2016-06-13 LAB — CULTURE, OB URINE

## 2016-06-20 ENCOUNTER — Other Ambulatory Visit: Payer: Self-pay | Admitting: Obstetrics and Gynecology

## 2016-06-20 ENCOUNTER — Ambulatory Visit (INDEPENDENT_AMBULATORY_CARE_PROVIDER_SITE_OTHER): Payer: Self-pay | Admitting: Obstetrics and Gynecology

## 2016-06-20 VITALS — BP 105/78 | HR 97 | Wt 232.9 lb

## 2016-06-20 DIAGNOSIS — O3429 Maternal care due to uterine scar from other previous surgery: Secondary | ICD-10-CM

## 2016-06-20 DIAGNOSIS — O34219 Maternal care for unspecified type scar from previous cesarean delivery: Secondary | ICD-10-CM

## 2016-06-20 DIAGNOSIS — O0993 Supervision of high risk pregnancy, unspecified, third trimester: Secondary | ICD-10-CM

## 2016-06-20 DIAGNOSIS — O480 Post-term pregnancy: Secondary | ICD-10-CM

## 2016-06-20 DIAGNOSIS — Z36 Encounter for antenatal screening of mother: Secondary | ICD-10-CM

## 2016-06-20 DIAGNOSIS — O0933 Supervision of pregnancy with insufficient antenatal care, third trimester: Secondary | ICD-10-CM

## 2016-06-20 LAB — POCT URINALYSIS DIP (DEVICE)
Glucose, UA: NEGATIVE mg/dL
HGB URINE DIPSTICK: NEGATIVE
Ketones, ur: NEGATIVE mg/dL
LEUKOCYTES UA: NEGATIVE
Nitrite: NEGATIVE
Protein, ur: NEGATIVE mg/dL
SPECIFIC GRAVITY, URINE: 1.02 (ref 1.005–1.030)
UROBILINOGEN UA: 1 mg/dL (ref 0.0–1.0)
pH: 6.5 (ref 5.0–8.0)

## 2016-06-20 NOTE — H&P (Signed)
Obstetrics Admission History & Physical  06/20/2016 - 4:26 PM Primary [facility]: [facility]  Chief Complaint: IOL for post dates  History of Present Illness  30 y.o. G2P1001 @ 8179w6d (Dating: EDC 7/7, LMP=30wk u/s), with the above CC. Pregnancy complicated by: h/o 2010 c-section for breech, late PNC, BMI 239..  Ms. Namon CirriRegina Mahurin states that she has had some decreased FM but no s/s of labor.  Review of Systems: as per HPI  PMHx:  Past Medical History  Diagnosis Date  . Medical history non-contributory    PSHx:  Past Surgical History  Procedure Laterality Date  . Cesarean section     Medications: PNV  Allergies: has No Known Allergies. OBHx:  OB History  Gravida Para Term Preterm AB SAB TAB Ectopic Multiple Living  2 1 1       1     # Outcome Date GA Lbr Len/2nd Weight Sex Delivery Anes PTL Lv  2 Current           1 Term 04/18/09    M CS-Unspec Spinal  Y    patient states that infant was 11 lbs  GYNHx:  History of STIs: No..             FHx:  Family History  Problem Relation Age of Onset  . Diabetes Father    Soc Hx:  Social History   Social History  . Marital Status: Single    Spouse Name: N/A  . Number of Children: N/A  . Years of Education: N/A   Occupational History  . Not on file.   Social History Main Topics  . Smoking status: Never Smoker   . Smokeless tobacco: Not on file  . Alcohol Use: No  . Drug Use: No  . Sexual Activity: No   Other Topics Concern  . Not on file   Social History Narrative    Objective  105/78 232 lbs  rNST Toco: negative  General: Well nourished, well developed female in no acute distress.  Skin:  Warm and dry.  Cardiovascular: S1, S2 normal, no murmur, rub or gallop, regular rate and rhythm Respiratory:  Clear to auscultation bilateral. Normal respiratory effort Abdomen: gravid, nttp Neuro/Psych:  Normal mood and affect.   SSE: deferred SVE: 1/50/-3/intermediate/mid position. Pelvis  feels adequate.  Leopolds/EFW: cephalic, 3600gm  Labs  none   Radiology As above. Incomplete anatomy scan on 5/8  Perinatal info   ABO, Rh: --/--/B POS, B POS (04/25 1341) Antibody: NEG (04/25 1341) Rubella: 6.87 (04/25 1341) RPR: Non Reactive (04/25 1341)  HBsAg: Negative (04/25 1331)  HIV: Non Reactive (04/25 1341)  GBS: neg 1 hr Glucola  119 Pap and HPV neg: 2017  Assessment & Plan   29 y.o. G2P1001 @ 3779w6d with prior c-section and decreased FM. Pt stable *IUP: fetal status reassuring. Routine care. [facility] *Post dates, prior c-section: pt previously signed TOLAC consent and reviewed with her again and slightly increased rupture risk given this will be an IOL and maternal/fetal morbidity reviewed with her and she would like to proceed. Pt told that if declines continuing with IOL to let us know. Likely foley bulb IOL. Set up for IOL tomorrow 7/14 @ 0730. *GBS: neg *incomplete anatomy scan: let peds know at delivery. Too late to order one at this point  Cornelia Copaharlie Dashanna Kinnamon, [name]. MD Attending [facility](Faculty Practice)

## 2016-06-20 NOTE — Progress Notes (Signed)
Admit orders signed and held H&P done  Tiffany Silva [name], [name] MD Attending [facility](Faculty Practice)

## 2016-06-20 NOTE — Progress Notes (Signed)
Pt reports decreased FM x3 days.

## 2016-06-21 ENCOUNTER — Inpatient Hospital Stay (HOSPITAL_COMMUNITY): Payer: Self-pay | Admitting: Anesthesiology

## 2016-06-21 ENCOUNTER — Encounter (HOSPITAL_COMMUNITY): Payer: Self-pay

## 2016-06-21 ENCOUNTER — Inpatient Hospital Stay (HOSPITAL_COMMUNITY)
Admission: RE | Admit: 2016-06-21 | Discharge: 2016-06-23 | DRG: 775 | Disposition: A | Discharge status: Home / Self Care | Payer: Self-pay | Source: Ambulatory Visit | Attending: Obstetrics & Gynecology | Admitting: Obstetrics & Gynecology

## 2016-06-21 VITALS — BP 115/75 | HR 82 | Temp 97.7°F | Resp 18 | Ht 65.0 in | Wt 232.0 lb

## 2016-06-21 DIAGNOSIS — Z833 Family history of diabetes mellitus: Secondary | ICD-10-CM

## 2016-06-21 DIAGNOSIS — O99214 Obesity complicating childbirth: Secondary | ICD-10-CM | POA: Diagnosis present

## 2016-06-21 DIAGNOSIS — Z3A41 41 weeks gestation of pregnancy: Secondary | ICD-10-CM

## 2016-06-21 DIAGNOSIS — O480 Post-term pregnancy: Principal | ICD-10-CM | POA: Diagnosis present

## 2016-06-21 DIAGNOSIS — K219 Gastro-esophageal reflux disease without esophagitis: Secondary | ICD-10-CM | POA: Diagnosis present

## 2016-06-21 DIAGNOSIS — O0933 Supervision of pregnancy with insufficient antenatal care, third trimester: Secondary | ICD-10-CM

## 2016-06-21 DIAGNOSIS — O0993 Supervision of high risk pregnancy, unspecified, third trimester: Secondary | ICD-10-CM

## 2016-06-21 DIAGNOSIS — O34211 Maternal care for low transverse scar from previous cesarean delivery: Secondary | ICD-10-CM | POA: Diagnosis present

## 2016-06-21 DIAGNOSIS — Z6838 Body mass index (BMI) 38.0-38.9, adult: Secondary | ICD-10-CM

## 2016-06-21 DIAGNOSIS — O34219 Maternal care for unspecified type scar from previous cesarean delivery: Secondary | ICD-10-CM

## 2016-06-21 DIAGNOSIS — O9962 Diseases of the digestive system complicating childbirth: Secondary | ICD-10-CM | POA: Diagnosis present

## 2016-06-21 LAB — TYPE AND SCREEN
ABO/RH(D): B POS
ANTIBODY SCREEN: NEGATIVE

## 2016-06-21 LAB — CBC
HEMATOCRIT: 32.8 % — AB (ref 36.0–46.0)
HEMOGLOBIN: 11.3 g/dL — AB (ref 12.0–15.0)
MCH: 31.7 pg (ref 26.0–34.0)
MCHC: 34.5 g/dL (ref 30.0–36.0)
MCV: 91.9 fL (ref 78.0–100.0)
Platelets: 211 10*3/uL (ref 150–400)
RBC: 3.57 MIL/uL — ABNORMAL LOW (ref 3.87–5.11)
RDW: 13.7 % (ref 11.5–15.5)
WBC: 6.2 10*3/uL (ref 4.0–10.5)

## 2016-06-21 MED ORDER — SENNOSIDES-DOCUSATE SODIUM 8.6-50 MG PO TABS
2.0000 | ORAL_TABLET | ORAL | Status: DC
  Administered 2016-06-22 (×2): 2 via ORAL
  Filled 2016-06-21 (×2): qty 2

## 2016-06-21 MED ORDER — LACTATED RINGERS IV SOLN: 500.0000 mL | Freq: Once | INTRAVENOUS | Status: DC

## 2016-06-21 MED ORDER — EPHEDRINE 5 MG/ML INJ
10.0000 mg | INTRAVENOUS | Status: DC | PRN
  Filled 2016-06-21: qty 2

## 2016-06-21 MED ORDER — ONDANSETRON HCL 4 MG/2ML IJ SOLN
4.0000 mg | INTRAMUSCULAR | Status: DC | PRN
Start: 2016-06-21 — End: 2016-06-23

## 2016-06-21 MED ORDER — LIDOCAINE HCL (PF) 1 % IJ SOLN
30.0000 mL | INTRAMUSCULAR | Status: DC | PRN
  Filled 2016-06-21: qty 30

## 2016-06-21 MED ORDER — LACTATED RINGERS IV SOLN: 500.0000 mL | INTRAVENOUS | Status: DC | PRN

## 2016-06-21 MED ORDER — OXYTOCIN 40 UNITS IN LACTATED RINGERS INFUSION - SIMPLE MED
1.0000 m[IU]/min | INTRAVENOUS | Status: DC
  Administered 2016-06-21: 2 m[IU]/min via INTRAVENOUS
  Filled 2016-06-21: qty 1000

## 2016-06-21 MED ORDER — PHENYLEPHRINE 40 MCG/ML (10ML) SYRINGE FOR IV PUSH (FOR BLOOD PRESSURE SUPPORT)
80.0000 ug | PREFILLED_SYRINGE | INTRAVENOUS | Status: DC | PRN
  Filled 2016-06-21: qty 5

## 2016-06-21 MED ORDER — TETANUS-DIPHTH-ACELL PERTUSSIS 5-2.5-18.5 LF-MCG/0.5 IM SUSP
0.5000 mL | Freq: Once | INTRAMUSCULAR | Status: DC
Start: 2016-06-22 — End: 2016-06-23

## 2016-06-21 MED ORDER — DIPHENHYDRAMINE HCL 50 MG/ML IJ SOLN: 12.5000 mg | INTRAMUSCULAR | Status: DC | PRN

## 2016-06-21 MED ORDER — LACTATED RINGERS IV SOLN
500.0000 mL | Freq: Once | INTRAVENOUS | Status: AC
  Administered 2016-06-21: 500 mL via INTRAVENOUS

## 2016-06-21 MED ORDER — PHENYLEPHRINE 40 MCG/ML (10ML) SYRINGE FOR IV PUSH (FOR BLOOD PRESSURE SUPPORT)
80.0000 ug | PREFILLED_SYRINGE | INTRAVENOUS | Status: DC | PRN
  Filled 2016-06-21: qty 10
  Filled 2016-06-21: qty 5

## 2016-06-21 MED ORDER — FENTANYL CITRATE (PF) 100 MCG/2ML IJ SOLN
50.0000 ug | INTRAMUSCULAR | Status: DC | PRN
  Administered 2016-06-21 (×2): 100 ug via INTRAVENOUS
  Filled 2016-06-21 (×2): qty 2

## 2016-06-21 MED ORDER — PRENATAL MULTIVITAMIN CH
1.0000 | ORAL_TABLET | Freq: Every day | ORAL | Status: DC
  Administered 2016-06-22 – 2016-06-23 (×2): 1 via ORAL
  Filled 2016-06-21 (×2): qty 1

## 2016-06-21 MED ORDER — ACETAMINOPHEN 325 MG PO TABS: 650.0000 mg | ORAL_TABLET | ORAL | Status: DC | PRN

## 2016-06-21 MED ORDER — ACETAMINOPHEN 325 MG PO TABS
650.0000 mg | ORAL_TABLET | ORAL | Status: DC | PRN
  Administered 2016-06-22 – 2016-06-23 (×3): 650 mg via ORAL
  Filled 2016-06-21 (×3): qty 2

## 2016-06-21 MED ORDER — BENZOCAINE-MENTHOL 20-0.5 % EX AERO
1.0000 "application " | INHALATION_SPRAY | CUTANEOUS | Status: DC | PRN
  Administered 2016-06-22: 1 via TOPICAL
  Filled 2016-06-21: qty 56

## 2016-06-21 MED ORDER — ONDANSETRON HCL 4 MG/2ML IJ SOLN: 4.0000 mg | Freq: Four times a day (QID) | INTRAMUSCULAR | Status: DC | PRN

## 2016-06-21 MED ORDER — LACTATED RINGERS IV SOLN
INTRAVENOUS | Status: DC
  Administered 2016-06-21: 11:00:00 via INTRAVENOUS

## 2016-06-21 MED ORDER — EPHEDRINE 5 MG/ML INJ: 10.0000 mg | INTRAVENOUS | Status: DC | PRN

## 2016-06-21 MED ORDER — TERBUTALINE SULFATE 1 MG/ML IJ SOLN
0.2500 mg | Freq: Once | INTRAMUSCULAR | Status: DC | PRN
  Filled 2016-06-21: qty 1

## 2016-06-21 MED ORDER — PHENYLEPHRINE 40 MCG/ML (10ML) SYRINGE FOR IV PUSH (FOR BLOOD PRESSURE SUPPORT): 80.0000 ug | PREFILLED_SYRINGE | INTRAVENOUS | Status: DC | PRN

## 2016-06-21 MED ORDER — ZOLPIDEM TARTRATE 5 MG PO TABS: 5.0000 mg | ORAL_TABLET | Freq: Every evening | ORAL | Status: DC | PRN

## 2016-06-21 MED ORDER — COCONUT OIL OIL
1.0000 "application " | TOPICAL_OIL | Status: DC | PRN
  Administered 2016-06-22: 1 via TOPICAL

## 2016-06-21 MED ORDER — FENTANYL 2.5 MCG/ML BUPIVACAINE 1/10 % EPIDURAL INFUSION (WH - ANES)
14.0000 mL/h | INTRAMUSCULAR | Status: DC | PRN
  Administered 2016-06-21: 14 mL/h via EPIDURAL
  Filled 2016-06-21: qty 125

## 2016-06-21 MED ORDER — OXYTOCIN 40 UNITS IN LACTATED RINGERS INFUSION - SIMPLE MED: 2.5000 [IU]/h | INTRAVENOUS | Status: DC

## 2016-06-21 MED ORDER — SIMETHICONE 80 MG PO CHEW: 80.0000 mg | CHEWABLE_TABLET | ORAL | Status: DC | PRN

## 2016-06-21 MED ORDER — SOD CITRATE-CITRIC ACID 500-334 MG/5ML PO SOLN: 30.0000 mL | ORAL | Status: DC | PRN

## 2016-06-21 MED ORDER — IBUPROFEN 600 MG PO TABS
600.0000 mg | ORAL_TABLET | Freq: Four times a day (QID) | ORAL | Status: DC
  Administered 2016-06-22 – 2016-06-23 (×7): 600 mg via ORAL
  Filled 2016-06-21 (×7): qty 1

## 2016-06-21 MED ORDER — OXYTOCIN BOLUS FROM INFUSION: 500.0000 mL | INTRAVENOUS | Status: DC

## 2016-06-21 MED ORDER — DIBUCAINE 1 % RE OINT: 1.0000 "application " | TOPICAL_OINTMENT | RECTAL | Status: DC | PRN

## 2016-06-21 MED ORDER — WITCH HAZEL-GLYCERIN EX PADS: 1.0000 "application " | MEDICATED_PAD | CUTANEOUS | Status: DC | PRN

## 2016-06-21 MED ORDER — DIPHENHYDRAMINE HCL 25 MG PO CAPS: 25.0000 mg | ORAL_CAPSULE | Freq: Four times a day (QID) | ORAL | Status: DC | PRN

## 2016-06-21 MED ORDER — ONDANSETRON HCL 4 MG PO TABS: 4.0000 mg | ORAL_TABLET | ORAL | Status: DC | PRN

## 2016-06-21 MED ORDER — LIDOCAINE HCL (PF) 1 % IJ SOLN
INTRAMUSCULAR | Status: DC | PRN
  Administered 2016-06-21: 6 mL via EPIDURAL
  Administered 2016-06-21: 6 mL

## 2016-06-21 MED ORDER — PHENYLEPHRINE 40 MCG/ML (10ML) SYRINGE FOR IV PUSH (FOR BLOOD PRESSURE SUPPORT)
80.0000 ug | PREFILLED_SYRINGE | INTRAVENOUS | Status: DC | PRN
  Filled 2016-06-21: qty 5
  Filled 2016-06-21: qty 10

## 2016-06-21 NOTE — Anesthesia Procedure Notes (Signed)
Epidural Patient location during procedure: OB Start time: 06/21/2016 3:25 PM End time: 06/21/2016 3:45 PM  Staffing Anesthesiologist: Jairo BenJACKSON, Jabreel Chimento Performed by: anesthesiologist   Preanesthetic Checklist Completed: patient identified, surgical consent, pre-op evaluation, timeout performed, IV checked, risks and benefits discussed and monitors and equipment checked  Epidural Patient position: sitting Prep: site prepped and draped and DuraPrep Patient monitoring: heart rate, continuous pulse ox and blood pressure Approach: midline Location: L2-L3 Injection technique: LOR air  Needle:  Needle type: Tuohy  Needle gauge: 17 G Needle length: 9 cm Needle insertion depth: 5.5 cm Catheter type: closed end flexible Catheter size: 19 Gauge Catheter at skin depth: 12 cm Test dose: negative (1% lidocaine )  Additional Notes Pt identified in Labor room.  Monitors applied. Working IV access confirmed. Sterile prep, drape lumbar spine.  1% lido local L 2,3.  #17ga Touhy LOR air at 5.5 cm L 2,3, cath in easily to 12 cm skin. Test dose OK, cath dosed and infusion begun.  Patient asymptomatic, VSS, no heme aspirated, tolerated well.  Sandford Craze Tondalaya Perren, MD Reason for block:procedure for pain

## 2016-06-21 NOTE — Progress Notes (Signed)
Dr Rockie NeighboursWoak notified that patient Tiffany Silva at 1415 clear fluid and that foley bulb fell out. No cervical check at this time. Will continue to monitor progress and need to do SVE.

## 2016-06-21 NOTE — H&P (Signed)
LABOR AND DELIVERY ADMISSION HISTORY AND PHYSICAL NOTE  Tiffany Silva is a 30 y.o. female G2P1001 with IUP at [redacted]w[redacted]d by L/31 presenting for pdiol.   She reports positive fetal movement. She denies leakage of fluid or vaginal bleeding.  Prenatal History/Complications:  Past Medical History: Past Medical History  Diagnosis Date  . Medical history non-contributory     Past Surgical History: Past Surgical History  Procedure Laterality Date  . Cesarean section      Obstetrical History: OB History    Gravida Para Term Preterm AB TAB SAB Ectopic Multiple Living   Social History: Social History   Social History  . Marital Status: Single    Spouse Name: N/A  . Number of Children: N/A  . Years of Education: N/A   Social History Main Topics  . Smoking status: Never Smoker   . Smokeless tobacco: None  . Alcohol Use: No  . Drug Use: No  . Sexual Activity: No   Other Topics Concern  . None   Social History Narrative    Family History: Family History  Problem Relation Age of Onset  . Diabetes Father     Allergies: No Known Allergies  Prescriptions prior to admission  Medication Sig Dispense Refill Last Dose  . Prenatal Vit-Fe Fumarate-FA (PRENATAL MULTIVITAMIN) TABS tablet Take 1 tablet by mouth daily at 12 noon.   06/20/2016 at Unknown time  . nitrofurantoin, macrocrystal-monohydrate, (MACROBID) 100 MG capsule Take 1 capsule (100 mg total) by mouth 2 (two) times daily. (Patient not taking: Reported on 06/12/2016) 14 capsule 0 Not Taking  . ondansetron (ZOFRAN ODT) 4 MG disintegrating tablet Take 1 tablet (4 mg total) by mouth every 8 (eight) hours as needed for nausea or vomiting. (Patient not taking: Reported on 06/12/2016) 15 tablet 0 Not Taking     Review of Systems   All systems reviewed and negative except as stated in HPI  Blood pressure 119/75, pulse 103, temperature 98.3 F (36.8 C), temperature source Oral, resp. rate 18, height   (1.651 m), weight 232 lb (105.235 kg), last menstrual period 09/08/2015. General appearance: alert, cooperative and appears stated age Lungs: clear to auscultation bilaterally Heart: regular rate and rhythm Abdomen: soft, non-tender; bowel sounds normal Extremities: No calf swelling or tenderness Presentation: cephalic Fetal monitoring: 140/mod/+a/-d Uterine activity: quiet  Dilation: 1.5 Effacement (%): Thick Exam by:: dr Ashok Pall   Prenatal labs: ABO, Rh: --/--/B POS, B POS (04/25 1341) Antibody: NEG (04/25 1341) Rubella: !Error!imm RPR: Non Reactive (04/25 1341)  HBsAg: Negative (04/25 1331)  HIV: Non Reactive (04/25 1341)  GBS: Negative (06/05 0000)  1 hr Glucola: 119 Genetic screening:  Too late Anatomy US: wnl but limited  Prenatal Transfer Tool  Maternal Diabetes: No Genetic Screening: Declined Maternal Ultrasounds/Referrals: Normal Fetal Ultrasounds or other Referrals:  None Maternal Substance Abuse:  No Significant Maternal Medications:  None Significant Maternal Lab Results: Lab values include: Group B Strep negative  Results for orders placed or performed during the hospital encounter of 06/21/16 (from the past 24 hour(s))  CBC   Collection Time: 06/21/16 10:44 AM  Result Value Ref Range   WBC 6.2 4.0 - 10.5 K/uL   RBC 3.57 (L) 3.87 - 5.11 MIL/uL   Hemoglobin 11.3 (L) 12.0 - 15.0 g/dL   HCT 16.1 (L) 09.6 - 04.5 %   MCV 91.9 78.0 - 100.0 fL   MCH 31.7 26.0 - 34.0  pg   MCHC 34.5 30.0 - 36.0 g/dL   RDW 40.913.7 81.111.5 - 91.415.5 %   Platelets 211 150 - 400 K/uL    Patient Active Problem List   Diagnosis Date Noted  . Post-dates pregnancy 06/21/2016  . Supervision of high risk pregnancy in third trimester 04/22/2016  . Late prenatal care affecting pregnancy, antepartum 04/22/2016  . Uterine scar from previous cesarean delivery affecting pregnancy 04/22/2016    Assessment: Tiffany Silva is a 30 y.o. G2P1001 at 662w0d here for pdiol. tolac.  #Labor: foley placed,  pit for ripening if foley not out this PM #Pain: Non-pharm for now #FWB: Cat 1 #ID:  gbs neg #MOF: breast #MOC: none #Circ:  outpt #TOLAC: consented  Tiffany Silva B [place] Lingenfelter 06/21/2016, 11:45 AM

## 2016-06-21 NOTE — Anesthesia Preprocedure Evaluation (Addendum)
Anesthesia Evaluation  Patient identified by MRN, date of birth, ID band Patient awake    Reviewed: Allergy & Precautions, NPO status , Patient's Chart, lab work & pertinent test results  History of Anesthesia Complications Negative for: history of anesthetic complications  Airway Mallampati: II  TM Distance: >3 FB Neck ROM: Full    Dental  (+) Dental Advisory Given   Pulmonary neg pulmonary ROS,    breath sounds clear to auscultation       Cardiovascular negative cardio ROS   Rhythm:Regular Rate:Normal     Neuro/Psych    GI/Hepatic Neg liver ROS, GERD  Poorly Controlled,  Endo/Other  Morbid obesity  Renal/GU negative Renal ROS     Musculoskeletal   Abdominal (+) + obese,   Peds  Hematology  (+) Blood dyscrasia (plt 211k ), ,   Anesthesia Other Findings   Reproductive/Obstetrics (+) Pregnancy                            Anesthesia Physical Anesthesia Plan  ASA: II  Anesthesia Plan: Epidural   Post-op Pain Management:    Induction:   Airway Management Planned: Natural Airway  Additional Equipment:   Intra-op Plan:   Post-operative Plan:   Informed Consent: I have reviewed the patients History and Physical, chart, labs and discussed the procedure including the risks, benefits and alternatives for the proposed anesthesia with the patient or authorized representative who has indicated his/her understanding and acceptance.   Dental advisory given  Plan Discussed with:   Anesthesia Plan Comments: (Patient identified. Risks/Benefits/Options discussed with patient including but not limited to bleeding, infection, nerve damage, paralysis, failed block, incomplete pain control, headache, blood pressure changes, nausea, vomiting, reactions to medication both or allergic, itching and postpartum back pain. Confirmed with bedside nurse the patient's most recent platelet count. Confirmed  with patient that they are not currently taking any anticoagulation, have any bleeding history or any family history of bleeding disorders. Patient expressed understanding and wished to proceed. All questions were answered.  )        Anesthesia Quick Evaluation

## 2016-06-21 NOTE — Anesthesia Pain Management Evaluation Note (Signed)
  [facility] Pain Management Visit Note  Patient: Tiffany CirriRegina Bise, 30 y.o., female  "Hello I am a member of the anesthesia team at [facility]. We have an anesthesia team available at all times to provide care throughout the hospital, including epidural management and anesthesia for C-section. I don't know your plan for the delivery whether it a natural birth, water birth, IV sedation, nitrous supplementation, doula or epidural, but we want to meet your pain goals."   1.Was your pain managed to your expectations on prior hospitalizations?   Yes   2.What is your expectation for pain management during this hospitalization?     Epidural  3.How can we help you reach that goal? Pain medication right now then epidural when I can get it.  Record the patient's initial score and the patient's pain goal.   Pain: 9  Pain Goal: 9 [facility] wants you to be able to say your pain was always managed very well.  Kalayah Leske 06/21/2016

## 2016-06-22 ENCOUNTER — Encounter (HOSPITAL_COMMUNITY): Payer: Self-pay

## 2016-06-22 LAB — RPR: RPR: NONREACTIVE

## 2016-06-22 NOTE — Anesthesia Postprocedure Evaluation (Signed)
Anesthesia Post Note  Patient: Tiffany Silva  Procedure(s) Performed: * No procedures listed *  Patient location during evaluation: Mother Baby Anesthesia Type: Epidural Level of consciousness: awake and alert, oriented and patient cooperative Pain management: pain level controlled Vital Signs Assessment: post-procedure vital signs reviewed and stable Respiratory status: spontaneous breathing Cardiovascular status: stable Postop Assessment: no headache, epidural receding, patient able to bend at knees and no signs of nausea or vomiting Anesthetic complications: no Comments: Pain score 2     Last Vitals:  Filed Vitals:   06/22/16 0013 06/22/16 0450  BP: 98/50 118/70  Pulse: 73 76  Temp: 36.9 C 36.7 C  Resp: 18 18    Last Pain:  Filed Vitals:   06/22/16 0538  PainSc: 4    Pain Goal: Patients Stated Pain Goal: 2 (06/21/16 1415)               Merrilyn PumaWRINKLE,Annayah Worthley

## 2016-06-22 NOTE — Clinical Social Work Maternal (Signed)
  CLINICAL SOCIAL WORK MATERNAL/CHILD NOTE  Patient Details  Name: Tiffany Silva MRN: [id number] Date of Birth: 1986/12/07  Date:  06/22/2016  Clinical Social Worker Initiating Note:   Rigoberto Noel, LCSW Date/ Time Initiated: 06/22/16 1415   Child's Name:    Tiffany Silva  Legal Guardian:    Tiffany Silva  Need for Interpreter:    No  Date of Referral:      06/21/16  Reason for Referral:    Late prenatal care  Referral Source:    RN  Address:    [phone] [place] [address] Phone number:    [phone]  Household Members:    Mother lives with cousin  Natural Supports (not living in the home):    Husband, sister, niece  Professional Supports:   None reported  Employment:   none  Type of Work:   NA  Education:    not current  Pensions consultant:    none while in the [place]  Other Resources:    none  Cultural/Religious Considerations Which May Impact Care:  Mother traveled to the [place] from [place]  Strengths:    Family support, basic needs met for baby  Risk Factors/Current Problems:     Mother is concerned about receiving a bill from the hospital since she does not have insurance.  Cognitive State:    Appropriate  Mood/Affect:    Flat affect  CSW Assessment: CSW met with MOB while FOB and mother's niece were present in the room. MOB confirmed that family could be present during the assessment. CSW explained the reason for social work referral due to documented late prenatal care. Mother reported that she lives in [place] where she receives prenatal care early in the pregnancy. She reported that she came to the [place] to visit family and after getting here she was told that she was too far along to fly back to [place]. CSW explained hospital policy and testing baby's urine and cord blood when there is late prenatal care. Mother acknowledged understanding.  Mother reported having supports and basic needs for baby while here in the [place] and home in [place]. Mother denies  SA and MH services. Mother denies substance use while pregnant. CSW informed mother of negative UDS results. Mother reported concerns about receiving a hospital bill being that she does not have insurance coverage here.   CSW Plan/Description:   Mother declined referral to community resources. Mother requesting resources for insurance coverage for current hospital admission.   Rigoberto Noel R, LCSW 06/22/2016, 2:35 PM

## 2016-06-22 NOTE — Progress Notes (Signed)
POSTPARTUM PROGRESS NOTE  Post Partum Day 1 Subjective:  Namon CirriRegina Weis is a 30 y.o. [id number] 5169w0d s/p vbac induction for post dates.  No acute events overnight.  Pt denies problems with ambulating, voiding or po intake.  She denies nausea or vomiting.  Pain is moderately controlled.  She has had flatus. She has not had bowel movement.  Lochia Moderate.   Objective: Blood pressure 118/70, pulse 76, temperature 98.1 F (36.7 C), temperature source Oral, resp. rate 18, height 5\' 5"  (1.651 m), weight 232 lb (105.235 kg), last menstrual period 09/08/2015, SpO2 99 %, unknown if currently breastfeeding.  Physical Exam:  General: alert, cooperative and no distress Lochia:normal flow Chest: CTAB Heart: RRR no m/r/g Abdomen: +BS, soft, nontender,  Uterine Fundus: firm, nontender DVT Evaluation: No calf swelling or tenderness Extremities: trace edema   Recent Labs  06/21/16 1044  HGB 11.3*  HCT 32.8*    Assessment/Plan:  ASSESSMENT: Namon CirriRegina Sherrard is a 30 y.o. [id number] 5669w0d s/p vbac induction for post dates  Plan for discharge tomorrow and Contraception undecided   LOS: 1 day   Ernestina Pennaicholas Schenk 06/22/2016, 10:27 AM

## 2016-06-22 NOTE — Progress Notes (Signed)
Post Partum Day 1  Subjective:  Tiffany Silva is a 30 y.o. G2P1001 4822w1d s/p SVD from PDIOL.[place].  No acute events overnight.  Pt denies problems with ambulating, voiding or po intake.  She denies nausea or vomiting.  Pain is well controlled.  She has not had flatus. She has not had bowel movement.  Lochia Moderate.  Plan for birth control is no method.  Method of Feeding: Breast  Objective: BP 118/70 mmHg  Pulse 76  Temp(Src) 98.1 F (36.7 C) (Oral)  Resp 18  Ht 5\' 5"  (1.651 m)  Wt 105.235 kg (232 lb)  BMI 38.61 kg/m2  SpO2 99%  LMP 09/08/2015  Physical Exam:  General: alert, cooperative and no distress Lochia:normal flow Chest: CTAB Heart: RRR no m/r/g Abdomen: +BS, soft, nontender, fundus firm at/below umbilicus Uterine Fundus: firm DVT Evaluation: No evidence of DVT seen on physical exam. Extremities: no edema   Recent Labs  06/21/16 1044  HGB 11.3*  HCT 32.8*    Assessment/Plan:  ASSESSMENT: Tiffany Silva is a 30 y.o. G2P1001 1022w1d ppd #1 s/p NSVD doing well.     LOS: 1 day   Jaci Lazierrin Santiana Glidden 06/22/2016, 7:35 AM

## 2016-06-22 NOTE — Lactation Note (Signed)
This note was copied from a baby's chart. Lactation Consultation Note  P2, Ex BF for 1.5 years. Left LC phone number and suggest mother call to view latch. Discussed basics. Mom encouraged to feed baby 8-12 times/24 hours and with feeding cues.  Mom made aware of O/P services, breastfeeding support groups, community resources, and our phone # for post-discharge questions.    Patient Name: Tiffany Namon CirriRegina Benninger Today's Date: 06/22/2016 Reason for consult: Initial assessment   Maternal Data Has patient been taught Hand Expression?: Yes Does the patient have breastfeeding experience prior to this delivery?: Yes  Feeding Feeding Type: Breast Fed Length of feed: 25 min  LATCH Score/Interventions                      Lactation Tools Discussed/Used     Consult Status Consult Status: Follow-up Date: 06/23/16 Follow-up type: In-patient    Dahlia ByesBerkelhammer, [facility] 06/22/2016, 11:21 AM

## 2016-06-23 MED ORDER — IBUPROFEN 600 MG PO TABS: 600.0000 mg | ORAL_TABLET | Freq: Four times a day (QID) | ORAL | Status: AC

## 2016-06-23 NOTE — Lactation Note (Signed)
This note was copied from a baby's chart. Lactation Consultation Note  Mother states her nipples are tender.  Encouraged her to work on deep latch and not pulling tissue away from baby's nose. Apply ebm and coconut oil for soreness. Assisted mother w/ breastfeeding.  R nipple is slightly inverted.  Had her prepump w/ hand pump to evert - nipple tender when pumped. Mother latched baby in cradle hold using scissor hold.  Demonstrated how to support breast under. Reviewed pg 24 in Baby and Me booklet. Reviewed engorgement care and monitoring voids/stools. Mom encouraged to feed baby 8-12 times/24 hours and with feeding cues.  Wake baby for feedings if needed and breastfeed on both breasts every feeding.  Compress breast to keep baby active during feeding and to view swallows.  Patient Name: Tiffany Namon CirriRegina Silva OZHYQ'MToday's Date: 06/23/2016 Reason for consult: Follow-up assessment   Maternal Data    Feeding Feeding Type: Breast Fed  LATCH Score/Interventions Latch: Grasps breast easily, tongue down, lips flanged, rhythmical sucking.  Audible Swallowing: A few with stimulation Intervention(s): Hand expression;Skin to skin Intervention(s): Hand expression;Alternate breast massage  Type of Nipple: Everted at rest and after stimulation  Comfort (Breast/Nipple): Filling, red/small blisters or bruises, mild/mod discomfort  Problem noted: Mild/Moderate discomfort Interventions (Mild/moderate discomfort): Hand expression;Pre-pump if needed  Hold (Positioning): Assistance needed to correctly position infant at breast and maintain latch.  LATCH Score: 7  Lactation Tools Discussed/Used     Consult Status Consult Status: Complete    [name], [name] 06/23/2016, 9:19 AM

## 2016-06-23 NOTE — Discharge Summary (Signed)
OB Discharge Summary  Patient Name: Tiffany Silva DOB: 01/10/1986 MRN: [id number]  Date of admission: 06/21/2016 Delivering MD: Cam Hai D   Date of discharge: 06/23/2016  Admitting diagnosis: INDUCTION Intrauterine pregnancy: [redacted]w[redacted]d     Secondary diagnosis:Active Problems:   Post-dates pregnancy  Additional problems:none     Discharge diagnosis: Term Pregnancy Delivered                                                                     Post partum procedures:none  Augmentation: Pitocin  Complications: None  Hospital course:  Induction of Labor With Vaginal Delivery   30 y.o. yo [id number] at [redacted]w[redacted]d was admitted to the hospital 06/21/2016 for induction of labor.  Indication for induction: Postdates.  Patient had an uncomplicated labor course as follows: Membrane Rupture Time/Date: 2:15 PM ,06/21/2016   Intrapartum Procedures: Episiotomy: None [1]                                         Lacerations:  1st degree [2]  Patient had delivery of a Viable infant.  Information for the patient's newborn:  Airyana, Sprunger [[id number]]  Delivery Method: VBAC, Spontaneous (Filed from Delivery Summary)   06/21/2016  Details of delivery can be found in separate delivery note.  Patient had a routine postpartum course. Patient is discharged home 06/23/2016.   Physical exam  Filed Vitals:   06/22/16 0013 06/22/16 0450 06/22/16 1753 06/23/16 0500  BP: 98/50 118/70 121/80 115/75  Pulse: 73 76 79 82  Temp: 98.4 F (36.9 C) 98.1 F (36.7 C) 97.5 F (36.4 C) 97.7 F (36.5 C)  TempSrc: Oral Oral Oral Oral  Resp: Height:      Weight:      SpO2:       General: alert, cooperative and no distress Lochia: appropriate Uterine Fundus: firm Incision: N/A DVT Evaluation: Negative Homan's sign. No cords or calf tenderness. No significant calf/ankle edema. Labs: Lab Results  Component Value Date   WBC 6.2 06/21/2016   HGB 11.3* 06/21/2016   HCT 32.8* 06/21/2016   MCV 91.9 06/21/2016   PLT 211 06/21/2016   No flowsheet data found.  Discharge instruction: per After Visit Summary and "Baby and Me Booklet".  After Visit Meds:    Medication List    ASK your doctor about these medications        nitrofurantoin (macrocrystal-monohydrate) 100 MG capsule  Commonly known as:  MACROBID  Take 1 capsule (100 mg total) by mouth 2 (two) times daily.     ondansetron 4 MG disintegrating tablet  Commonly known as:  ZOFRAN ODT  Take 1 tablet (4 mg total) by mouth every 8 (eight) hours as needed for nausea or vomiting.     prenatal multivitamin Tabs tablet  Take 1 tablet by mouth daily at 12 noon.        Diet: routine diet  Activity: Advance as tolerated. Pelvic rest for 6 weeks.   Outpatient follow up:6 weeks Follow up Appt:No future appointments. Follow up visit: No Follow-up on file.  Postpartum contraception: Undecided  Newborn Data: Live born  female  Birth Weight: 8 lb 1 oz (3657 g) APGAR: 9, 9  Baby Feeding: Breast Disposition:home with mother   06/23/2016 Tiffany Silva, CNM

## 2016-07-25 ENCOUNTER — Encounter: Payer: Self-pay | Admitting: *Deleted

## 2016-08-19 ENCOUNTER — Ambulatory Visit: Payer: Self-pay | Admitting: Obstetrics and Gynecology

## 2017-07-05 IMAGING — US US MFM OB COMP +14 WKS
1 series · 14 of 28 positions shown · non-contrast
Comparison: none

[Series 1: us mfm ob comp +14 wks · 14 of 57 slices shown]
[im 3/57]
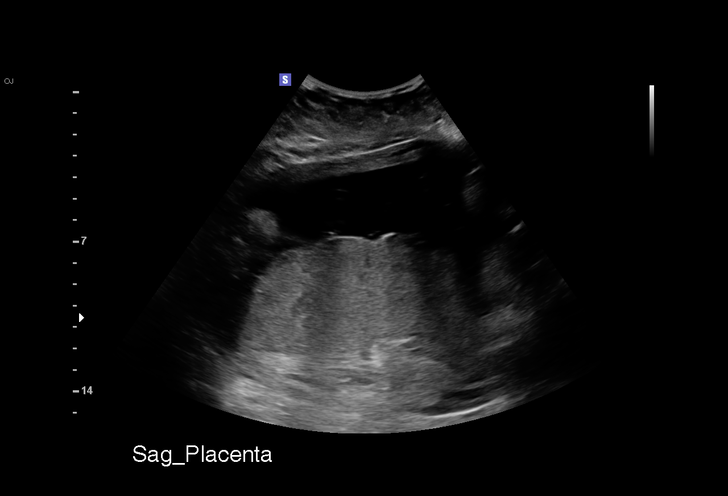
[im 7/57]
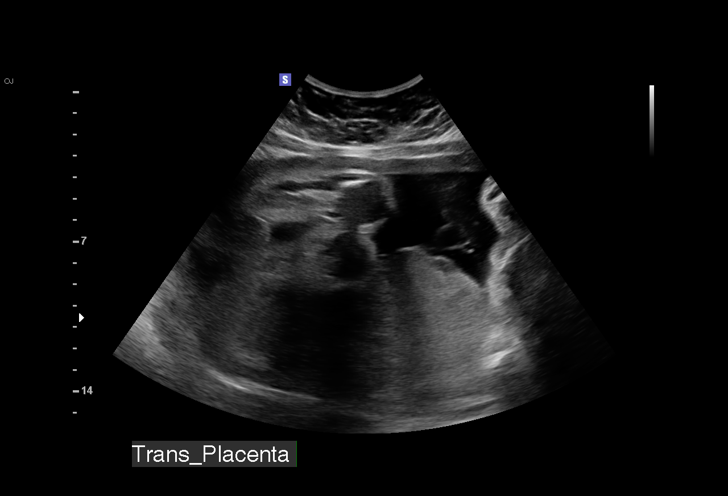
[im 11/57]
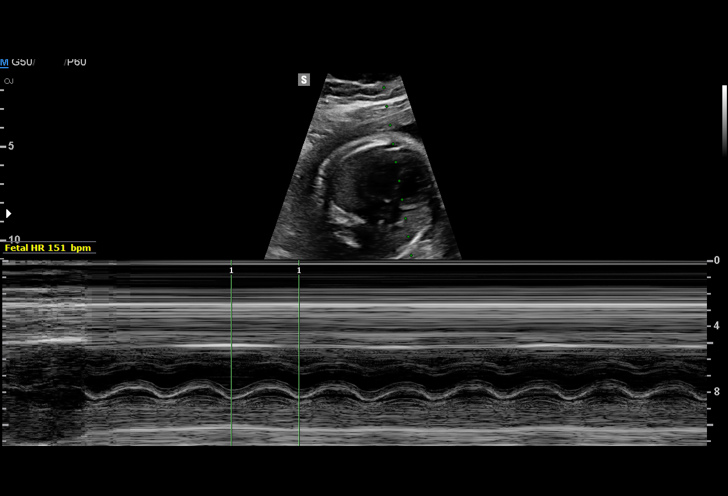
[im 15/57]
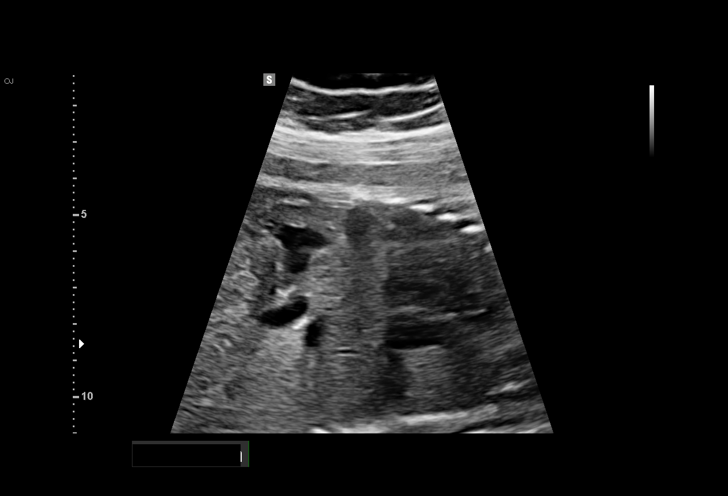
[im 19/57]
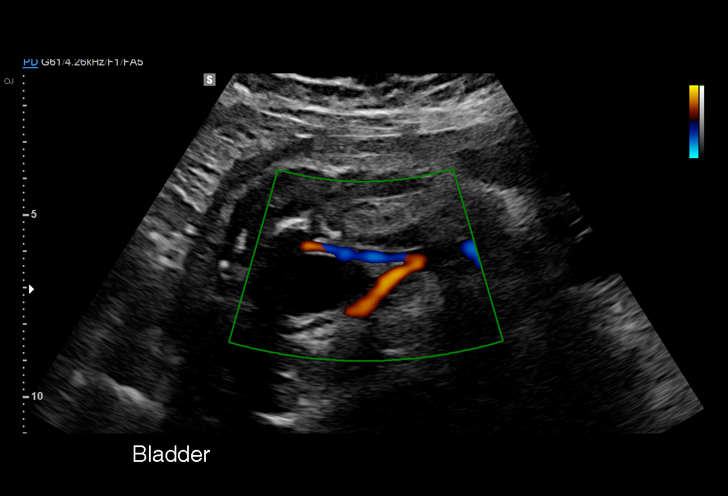
[im 23/57]
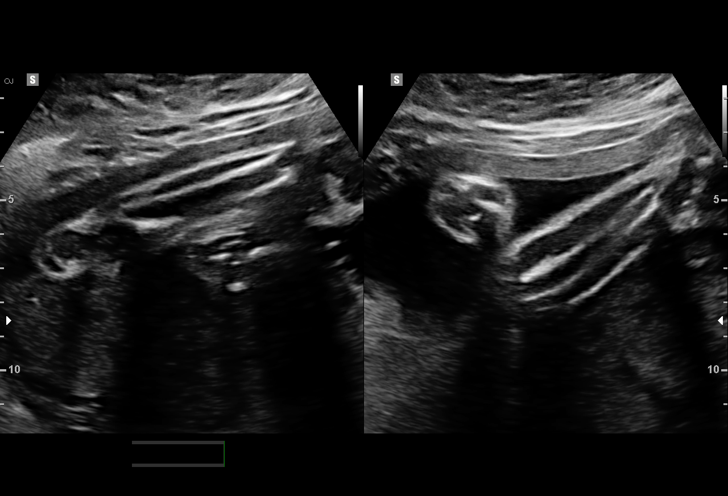
[im 27/57]
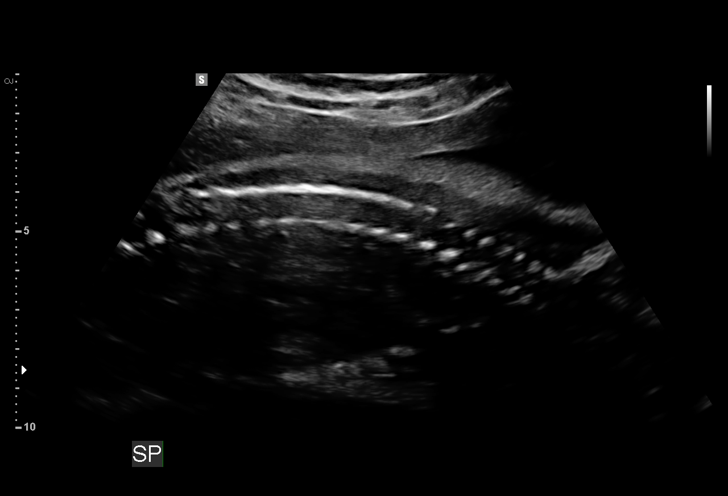
[im 32/57]
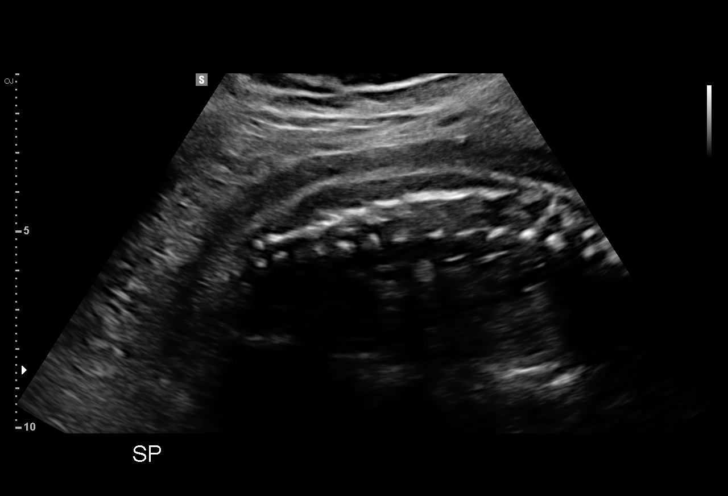
[im 36/57]
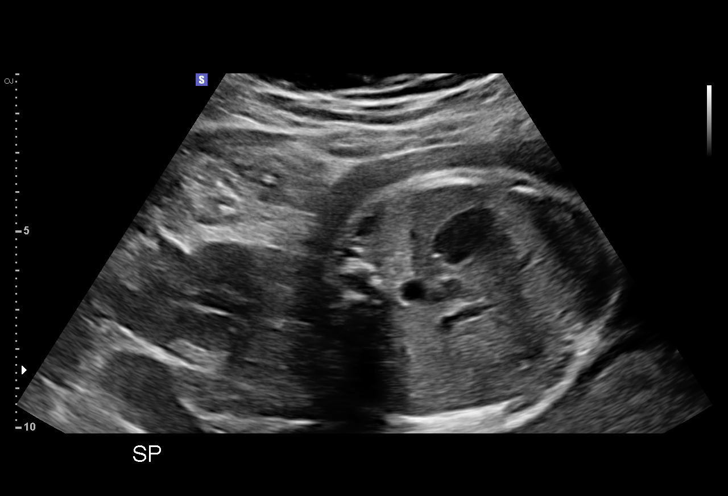
[im 40/57]
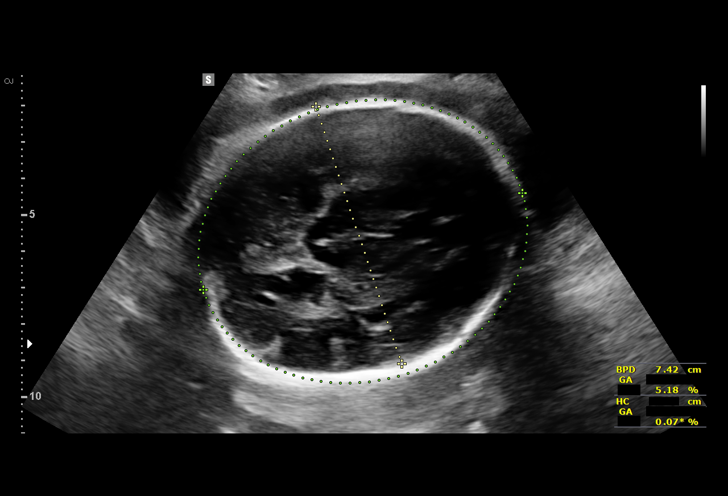
[im 44/57]
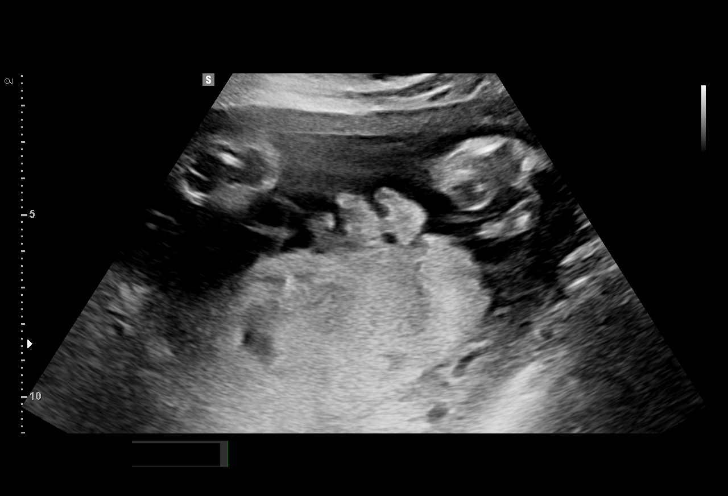
[im 48/57]
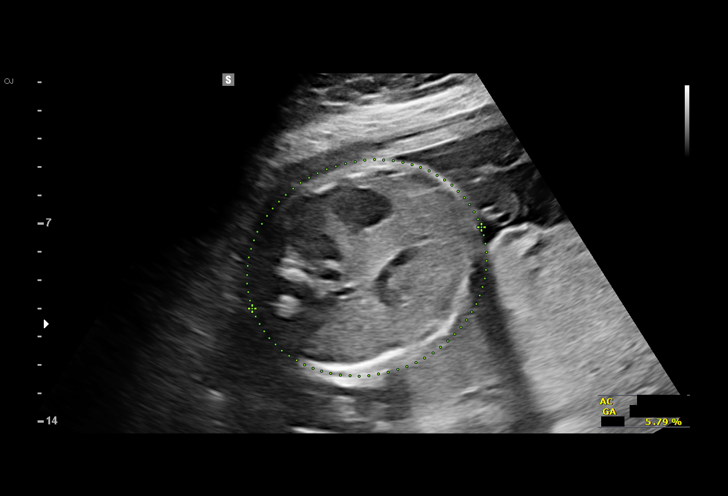
[im 52/57]
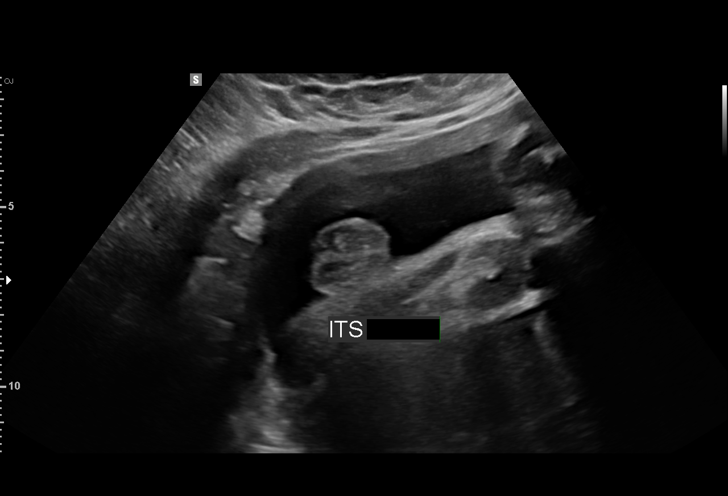
[im 57/57]
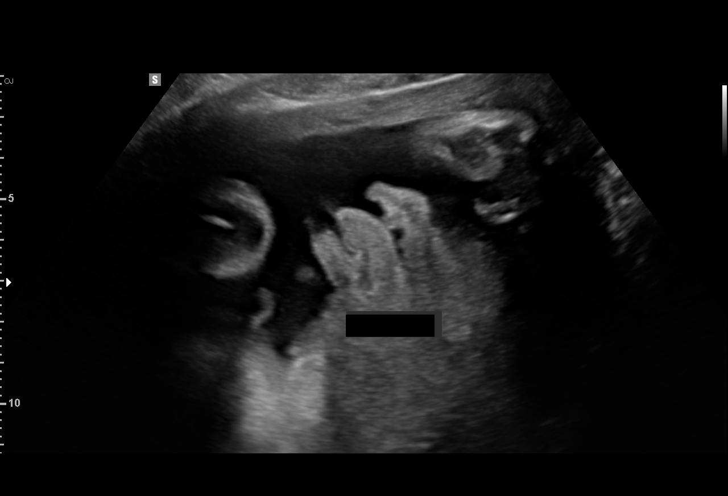

[14 of 28 positions shown; findings below may reference images not displayed]

[facility]yn Clinic
[REDACTED]-
Faculty Physician

1  LULIS CON                [id number]      [phone]     [id number]
Indications

Late to prenatal care, third trimester
31 weeks gestation of pregnancy
Basic anatomic survey                          Z36
OB History

Blood Type:            Height:  5'4"   Weight (lb):  221      BMI:
Gravidity:    2         Term:   1        Prem:   0        SAB:   0
TOP:          0       Ectopic:  0        Living: 1
Fetal Evaluation

Num Of Fetuses:     1
Fetal Heart         151
Rate(bpm):
Cardiac Activity:   Observed
Presentation:       Cephalic
Placenta:           Posterior, above cervical os
P. Cord Insertion:  Visualized

Amniotic Fluid
AFI FV:      Subjectively within normal limits

AFI Sum(cm)     %Tile       Largest Pocket(cm)
11.52           27

RUQ(cm)       RLQ(cm)       LUQ(cm)        LLQ(cm)
2.42
Biometry

BPD:      75.4  mm     G. Age:  30w 2d         11  %    CI:        77.46   %   70 - 86
FL/HC:      21.8   %   19.3 -
HC:      271.2  mm     G. Age:  29w 4d        < 3  %    HC/AC:      1.05       0.96 -
AC:      257.5  mm     G. Age:  30w 0d         11  %    FL/BPD:     78.2   %   71 - 87
FL:         59  mm     G. Age:  30w 6d         21  %    FL/AC:      22.9   %   20 - 24
HUM:      52.8  mm     G. Age:  30w 5d         38  %

Est. FW:    3331  gm      3 lb 6 oz     30  %
Gestational Age

LMP:           31w 3d       Date:   09/08/15                 EDD:   06/14/16
U/S Today:     30w 1d                                        EDD:   06/23/16
Best:          31w 3d    Det. By:   LMP  (09/08/15)          EDD:   06/14/16
Anatomy

Cranium:               Appears normal         Aortic Arch:            Appears normal
Cavum:                 Appears normal         Ductal Arch:            Not well visualized
Ventricles:            Appears normal         Diaphragm:              Appears normal
Choroid Plexus:        Appears normal         Stomach:                Appears normal, left
sided
Cerebellum:            Not well visualized    Abdomen:                Appears normal
Posterior Fossa:       Not well visualized    Abdominal Wall:         Not well visualized
Nuchal Fold:           Not applicable (>20    Cord Vessels:           Not well visualized
wks GA)
Face:                  Orbits nl; profile not Kidneys:                Not well visualized
well visualized
Lips:                  Appears normal         Bladder:                Appears normal
Thoracic:              Appears normal         Spine:                  Limited views
appear normal
Heart:                 Appears normal         Upper Extremities:      Appears normal
(4CH, axis, and
situs)
RVOT:                  Not well visualized    Lower Extremities:      Appears normal
LVOT:                  Appears normal

Other:  Male gender. Technically difficult due to advanced GA and fetal
position.
Cervix Uterus Adnexa

Cervix
Not visualized (advanced GA >45wks)
Impression

Single IUP at 31w 3d
Late onset of care
Limited views of the fetal anatomy obtained due to fetal
position and late gestational age
No gross anomalies noted
The estimated fetal weight is at the 30th %tile
Normal amniotic fluid volume
Recommendations

Consider ultrasound in 4 weeks for growth given late onset of
care - will reevaluate fetal anatomy at that time
